# Patient Record
Sex: Male | Born: 1990 | Race: White | Hispanic: No | Marital: Married | Smoking: Never smoker
Health system: Southern US, Community
[De-identification: ages and names within clinical notes are randomized; demographics above are authoritative.]

## PROBLEM LIST (undated history)

## (undated) DIAGNOSIS — R7301 Impaired fasting glucose: Secondary | ICD-10-CM

## (undated) DIAGNOSIS — K219 Gastro-esophageal reflux disease without esophagitis: Secondary | ICD-10-CM

## (undated) DIAGNOSIS — E663 Overweight: Secondary | ICD-10-CM

## (undated) DIAGNOSIS — R7989 Other specified abnormal findings of blood chemistry: Secondary | ICD-10-CM

## (undated) HISTORY — DX: Other specified abnormal findings of blood chemistry: R79.89

## (undated) HISTORY — DX: Impaired fasting glucose: R73.01

## (undated) HISTORY — PX: APPENDECTOMY: SHX54

## (undated) HISTORY — DX: Overweight: E66.3

---
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
## 2012-11-12 DIAGNOSIS — Z87442 Personal history of urinary calculi: Secondary | ICD-10-CM | POA: Insufficient documentation

## 2015-09-04 ENCOUNTER — Other Ambulatory Visit
Admission: RE | Admit: 2015-09-04 | Discharge: 2015-09-04 | Disposition: A | Discharge status: Home / Self Care | Payer: Worker's Compensation | Attending: Family Medicine | Admitting: Family Medicine

## 2016-07-02 ENCOUNTER — Other Ambulatory Visit
Admission: EM | Admit: 2016-07-02 | Discharge: 2016-07-02 | Disposition: A | Discharge status: Home / Self Care | Payer: PRIVATE HEALTH INSURANCE | Attending: Family Medicine | Admitting: Family Medicine

## 2020-04-14 ENCOUNTER — Other Ambulatory Visit: Payer: Self-pay

## 2020-04-14 ENCOUNTER — Encounter: Payer: Self-pay | Admitting: *Deleted

## 2020-04-14 ENCOUNTER — Emergency Department: Payer: 59

## 2020-04-14 ENCOUNTER — Emergency Department
Admission: EM | Admit: 2020-04-14 | Discharge: 2020-04-15 | Disposition: A | Discharge status: Home / Self Care | Payer: 59 | Attending: Emergency Medicine | Admitting: Emergency Medicine

## 2020-04-14 DIAGNOSIS — N201 Calculus of ureter: Secondary | ICD-10-CM | POA: Diagnosis not present

## 2020-04-14 DIAGNOSIS — R1032 Left lower quadrant pain: Secondary | ICD-10-CM | POA: Diagnosis present

## 2020-04-14 DIAGNOSIS — R112 Nausea with vomiting, unspecified: Secondary | ICD-10-CM | POA: Diagnosis not present

## 2020-04-14 LAB — COMPREHENSIVE METABOLIC PANEL
ALT: 60 U/L — ABNORMAL HIGH (ref 0–44)
AST: 37 U/L (ref 15–41)
Albumin: 5.2 g/dL — ABNORMAL HIGH (ref 3.5–5.0)
Alkaline Phosphatase: 48 U/L (ref 38–126)
Anion gap: 17 — ABNORMAL HIGH (ref 5–15)
BUN: 17 mg/dL (ref 6–20)
CO2: 18 mmol/L — ABNORMAL LOW (ref 22–32)
Calcium: 10.2 mg/dL (ref 8.9–10.3)
Chloride: 103 mmol/L (ref 98–111)
Creatinine, Ser: 1.44 mg/dL — ABNORMAL HIGH (ref 0.61–1.24)
GFR calc Af Amer: >60 mL/min (ref >60)
GFR calc non Af Amer: >60 mL/min (ref >60)
Glucose, Bld: 149 mg/dL — ABNORMAL HIGH (ref 70–99)
Potassium: 3.9 mmol/L (ref 3.5–5.1)
Sodium: 138 mmol/L (ref 135–145)
Total Bilirubin: 0.9 mg/dL (ref 0.3–1.2)
Total Protein: 8.4 g/dL — ABNORMAL HIGH (ref 6.5–8.1)

## 2020-04-14 LAB — CBC
HCT: 48.1 % (ref 39.0–52.0)
Hemoglobin: 17.1 g/dL — ABNORMAL HIGH (ref 13.0–17.0)
MCH: 30.2 pg (ref 26.0–34.0)
MCHC: 35.6 g/dL (ref 30.0–36.0)
MCV: 84.8 fL (ref 80.0–100.0)
Platelets: 247 10*3/uL (ref 150–400)
RBC: 5.67 MIL/uL (ref 4.22–5.81)
RDW: 11.9 % (ref 11.5–15.5)
WBC: 13.1 10*3/uL — ABNORMAL HIGH (ref 4.0–10.5)
nRBC: 0 % (ref 0.0–0.2)

## 2020-04-14 MED ORDER — KETOROLAC TROMETHAMINE 10 MG PO TABS: 10.0000 mg | ORAL_TABLET | Freq: Four times a day (QID) | ORAL | 0 refills | Status: DC | PRN

## 2020-04-14 MED ORDER — SODIUM CHLORIDE 0.9 % IV BOLUS
1000.0000 mL | Freq: Once | INTRAVENOUS | Status: AC
  Administered 2020-04-14: 22:00:00 1000 mL via INTRAVENOUS

## 2020-04-14 MED ORDER — MORPHINE SULFATE (PF) 4 MG/ML IV SOLN: 4.0000 mg | Freq: Once | INTRAVENOUS | Status: AC

## 2020-04-14 MED ORDER — ONDANSETRON 4 MG PO TBDP
4.0000 mg | ORAL_TABLET | Freq: Three times a day (TID) | ORAL | 0 refills | Status: DC | PRN
Start: 2020-04-14 — End: 2020-04-30

## 2020-04-14 MED ORDER — ONDANSETRON HCL 4 MG/2ML IJ SOLN
INTRAMUSCULAR | Status: AC
  Administered 2020-04-14: 22:00:00 4 mg via INTRAVENOUS
  Filled 2020-04-14: qty 2

## 2020-04-14 MED ORDER — ONDANSETRON 4 MG PO TBDP
4.0000 mg | ORAL_TABLET | Freq: Three times a day (TID) | ORAL | 0 refills | Status: DC | PRN
Start: 2020-04-14 — End: 2020-04-14

## 2020-04-14 MED ORDER — KETOROLAC TROMETHAMINE 30 MG/ML IJ SOLN
INTRAMUSCULAR | Status: AC
  Administered 2020-04-14: 22:00:00 15 mg via INTRAVENOUS
  Filled 2020-04-14: qty 1

## 2020-04-14 MED ORDER — MORPHINE SULFATE (PF) 4 MG/ML IV SOLN
INTRAVENOUS | Status: AC
  Administered 2020-04-14: 22:00:00 4 mg via INTRAVENOUS
  Filled 2020-04-14: qty 1

## 2020-04-14 MED ORDER — ONDANSETRON HCL 4 MG/2ML IJ SOLN: 4.0000 mg | Freq: Once | INTRAMUSCULAR | Status: AC

## 2020-04-14 MED ORDER — KETOROLAC TROMETHAMINE 30 MG/ML IJ SOLN: 15.0000 mg | INTRAMUSCULAR | Status: AC

## 2020-04-14 MED ORDER — SODIUM CHLORIDE 0.9 % IV SOLN
1.5000 mg/kg | Freq: Once | INTRAVENOUS | Status: AC
  Administered 2020-04-14: 23:00:00 150 mg via INTRAVENOUS
  Filled 2020-04-14: qty 7.5

## 2020-04-14 NOTE — ED Provider Notes (Signed)
[facility] Emergency Department Provider Note  ____________________________________________  Time seen: Approximately 10:25 PM  I have reviewed the triage vital signs and the nursing notes.   HISTORY  Chief Complaint Flank Pain    HPI Jacob Payne is a 29 y.o. male   with no significant past medical history who comes the ED complaining of left flank pain radiating around to left lower quadrant and left groin that started earlier today.  Constant, waxing and waning, increasingly severe as the day is gone on.  No aggravating or alleviating factors.  Denies dysuria frequency urgency or hematuria.  No fever chills or diarrhea.  Does have some nausea and vomiting this afternoon.    Past medical history noncontributory   There are no problems to display for this patient.   Past surgical history appendectomy    Prior to Admission medications   Medication Sig Start Date End Date Taking? Authorizing Provider  ketorolac (TORADOL) 10 MG tablet Take 1 tablet (10 mg total) by mouth every 6 (six) hours as needed for moderate pain. 04/14/20   Sharman Cheek, MD  ondansetron (ZOFRAN ODT) 4 MG disintegrating tablet Take 1 tablet (4 mg total) by mouth every 8 (eight) hours as needed for nausea or vomiting. 04/14/20   Sharman Cheek, MD   none  Allergies Patient has no known allergies.   No family history on file.  Social History Social History   Tobacco Use  . Smoking status: Never Smoker  . Smokeless tobacco: Never Used  Substance Use Topics  . Alcohol use: Not on file  . Drug use: Not on file    Review of Systems  Constitutional:   No fever or chills.  ENT:   No sore throat. No rhinorrhea. Cardiovascular:   No chest pain or syncope. Respiratory:   No dyspnea or cough. Gastrointestinal:   Positive as above for left flank pain and vomiting  Musculoskeletal:   Negative for focal pain or swelling All other systems reviewed and are  negative except as documented above in ROS and HPI.  ____________________________________________   PHYSICAL EXAM:  VITAL SIGNS: ED Triage Vitals  Enc Vitals Group     BP 04/14/20 2159 (!) 138/92     Pulse Rate 04/14/20 2159 (!) 108     Resp 04/14/20 2159 (!) 22     Temp 04/14/20 2159 97.6 F (36.4 C)     Temp Source 04/14/20 2159 Oral     SpO2 04/14/20 2159 99 %     Weight 04/14/20 2201 220 lb (99.8 kg)     Height 04/14/20 2201 5\' 9"  (1.753 m)     Head Circumference --      Peak Flow --      Pain Score 04/14/20 2200 10     Pain Loc --      Pain Edu? --      Excl. in GC? --     Vital signs reviewed, nursing assessments reviewed.   Constitutional:   Alert and oriented. Non-toxic appearance. Eyes:   Conjunctivae are normal. EOMI. PERRL. ENT      Head:   Normocephalic and atraumatic.      Nose:   Wearing a mask.      Mouth/Throat:   Wearing a mask.      Neck:   No meningismus. Full ROM. Hematological/Lymphatic/Immunilogical:   No cervical or inguinal lymphadenopathy. Cardiovascular:   RRR. Symmetric bilateral radial and DP pulses.  No murmurs. Cap refill less than 2 seconds. Respiratory:  Normal respiratory effort without tachypnea/retractions. Breath sounds are clear and equal bilaterally. No wheezes/rales/rhonchi. Gastrointestinal:   Soft with left sided tenderness. Non distended. There is no CVA tenderness.  No rebound, rigidity, or guarding. No ventral or inguinal hernia Musculoskeletal:   Normal range of motion in all extremities. No joint effusions.  No lower extremity tenderness.  No edema. Neurologic:   Normal speech and language.  Motor grossly intact. No acute focal neurologic deficits are appreciated.  Skin:    Skin is warm, dry and intact. No rash noted.  No petechiae, purpura, or bullae.  ____________________________________________    LABS (pertinent positives/negatives) (all labs ordered are listed, but only abnormal results are displayed) Labs  Reviewed  CBC - Abnormal; Notable for the following components:      Result Value   WBC 13.1 (*)    Hemoglobin 17.1 (*)    All other components within normal limits  COMPREHENSIVE METABOLIC PANEL - Abnormal; Notable for the following components:   CO2 18 (*)    Glucose, Bld 149 (*)    Creatinine, Ser 1.44 (*)    Total Protein 8.4 (*)    Albumin 5.2 (*)    ALT 60 (*)    Anion gap 17 (*)    All other components within normal limits  URINALYSIS, COMPLETE (UACMP) WITH MICROSCOPIC   ____________________________________________   EKG    ____________________________________________    RADIOLOGY  DG Abdomen 1 View  Result Date: 04/14/2020 CLINICAL DATA:  Left-sided flank pain EXAM: ABDOMEN - 1 VIEW COMPARISON:  CT 08/27/2010 FINDINGS: Nonobstructed gas pattern. No calculi over the kidneys. Punctate calcifications in the pelvis probably phleboliths. Regional bones are within normal limits IMPRESSION: 1. Nonobstructed gas pattern 2. Punctate calcifications in the pelvis bilaterally some of which are consistent with phleboliths. If urinary tract calculi are suspected, correlation with CT KUB could be obtained. Electronically Signed   By: Jasmine Pang M.D.   On: 04/14/2020 22:39   CT Renal Stone Study  Result Date: 04/14/2020 CLINICAL DATA:  Left flank pain EXAM: CT ABDOMEN AND PELVIS WITHOUT CONTRAST TECHNIQUE: Multidetector CT imaging of the abdomen and pelvis was performed following the standard protocol without IV contrast. COMPARISON:  None. FINDINGS: Lower chest: The visualized heart size within normal limits. No pericardial fluid/thickening. No hiatal hernia. The visualized portions of the lungs are clear. Hepatobiliary: Although limited due to the lack of intravenous contrast, normal in appearance without gross focal abnormality. No evidence of calcified gallstones or biliary ductal dilatation. Pancreas:  Unremarkable.  No surrounding inflammatory changes. Spleen: Normal in size.  Although limited due to the lack of intravenous contrast, normal in appearance. Adrenals/Urinary Tract: Both adrenal glands appear normal. There is mild left-sided pelvicaliectasis and ureterectasis down to the level of the distal left ureter where there is a 3 mm calculus, series 2, image 83. Mild left proximal periureteral stranding is seen. The bladder is unremarkable. No right-sided renal or collecting system calculi are noted. Stomach/Bowel: The stomach, small bowel, and colon are normal in appearance. No inflammatory changes or obstructive findings. The patient is status post appendectomy. Vascular/Lymphatic: There are no enlarged abdominal or pelvic lymph nodes. No significant gross vascular findings are present. Reproductive: The prostate is unremarkable. Other: No evidence of abdominal wall mass or hernia. Musculoskeletal: No acute or significant osseous findings. IMPRESSION: 3 mm left distal ureteral calculus causing mild left hydronephrosis. Electronically Signed   By: Jonna Clark M.D.   On: 04/14/2020 23:11    ____________________________________________   PROCEDURES  Procedures  ____________________________________________  DIFFERENTIAL DIAGNOSIS   Ureterolithiasis, cystitis, diverticulitlis, AGE.   CLINICAL IMPRESSION / ASSESSMENT AND PLAN / ED COURSE  Medications ordered in the ED: Medications  lidocaine (XYLOCAINE) 150 mg in sodium chloride 0.9 % 100 mL IVPB (has no administration in time range)  ketorolac (TORADOL) 30 MG/ML injection 15 mg (15 mg Intravenous Given 04/14/20 2217)  morphine 4 MG/ML injection 4 mg (4 mg Intravenous Given 04/14/20 2217)  ondansetron (ZOFRAN) injection 4 mg (4 mg Intravenous Given 04/14/20 2217)  sodium chloride 0.9 % bolus 1,000 mL (1,000 mLs Intravenous New Bag/Given 04/14/20 2218)    Pertinent labs & imaging results that were available during Jacob care of the patient were reviewed by me and considered in Jacob medical decision making (see chart for  details).  Jacob Payne was evaluated in Emergency Department on 04/14/2020 for the symptoms described in the history of present illness. He was evaluated in the context of the global COVID-19 pandemic, which necessitated consideration that the patient might be at risk for infection with the SARS-CoV-2 virus that causes COVID-19. Institutional protocols and algorithms that pertain to the evaluation of patients at risk for COVID-19 are in a state of rapid change based on information released by regulatory bodies including the CDC and federal and state organizations. These policies and algorithms were followed during the patient's care in the ED.   Pt p/w l flank pain and exam highly suggestive of renal colic. Doubt sepsis, torsion, incarcerated hernia. No acute abdomen on exam. Will give iv toradol 15mg , iv morphine 4mg  for pain relief, obtain labs and KUB.   Clinical Course as of Apr 14 2322  Tue Apr 14, 2020  2249 Still severe pain despite morphine and Toradol.  Will give IV lidocaine, obtain CT renal stone study.   [PS]    Clinical Course User Index [PS] Carrie Mew, MD    ----------------------------------------- 11:22 PM on 04/14/2020 -----------------------------------------  CT scan shows 43mm stone in distal left ureter with mild hydronephrosis. Will f/u UA, plan for outpatient follow up.    ____________________________________________   FINAL CLINICAL IMPRESSION(S) / ED DIAGNOSES    Final diagnoses:  Ureterolithiasis     ED Discharge Orders         Ordered    ketorolac (TORADOL) 10 MG tablet  Every 6 hours PRN     04/14/20 2320    ondansetron (ZOFRAN ODT) 4 MG disintegrating tablet  Every 8 hours PRN     04/14/20 2320          Portions of this note were generated with dragon dictation software. Dictation errors may occur despite best attempts at proofreading.   Carrie Mew, MD 04/14/20 [phone]

## 2020-04-14 NOTE — Discharge Instructions (Signed)
Your CT scan shows a small stone in the left ureter causing the pain. It has almost reached the bladder, at which point your symptoms will dramatically improve. Drink lots of fluids for hydration to help pass the stone.

## 2020-04-14 NOTE — ED Notes (Signed)
Pt taken to CT.

## 2020-04-14 NOTE — ED Notes (Signed)
Pt unable to void at this time. 

## 2020-04-14 NOTE — ED Triage Notes (Signed)
Pt reports left flank pain for 1 days.  Pt has n/v.  Diaphoretic in triage. Pt pale.

## 2020-04-15 ENCOUNTER — Other Ambulatory Visit: Payer: Self-pay

## 2020-04-15 ENCOUNTER — Other Ambulatory Visit: Payer: Self-pay | Admitting: Urology

## 2020-04-15 ENCOUNTER — Ambulatory Visit (INDEPENDENT_AMBULATORY_CARE_PROVIDER_SITE_OTHER): Payer: 59 | Admitting: Urology

## 2020-04-15 ENCOUNTER — Encounter: Payer: Self-pay | Admitting: Urology

## 2020-04-15 ENCOUNTER — Ambulatory Visit
Admission: RE | Admit: 2020-04-15 | Discharge: 2020-04-15 | Disposition: A | Discharge status: Home / Self Care | Payer: 59 | Source: Ambulatory Visit | Attending: Urology | Admitting: Urology

## 2020-04-15 VITALS — BP 131/90 | HR 93 | Ht 69.0 in | Wt 220.0 lb

## 2020-04-15 DIAGNOSIS — N23 Unspecified renal colic: Secondary | ICD-10-CM | POA: Diagnosis not present

## 2020-04-15 DIAGNOSIS — N201 Calculus of ureter: Secondary | ICD-10-CM

## 2020-04-15 DIAGNOSIS — N132 Hydronephrosis with renal and ureteral calculous obstruction: Secondary | ICD-10-CM | POA: Diagnosis not present

## 2020-04-15 DIAGNOSIS — N200 Calculus of kidney: Secondary | ICD-10-CM

## 2020-04-15 LAB — URINALYSIS, COMPLETE (UACMP) WITH MICROSCOPIC
Bacteria, UA: NONE SEEN
Bilirubin Urine: NEGATIVE
Glucose, UA: NEGATIVE mg/dL
Ketones, ur: 20 mg/dL — AB
Leukocytes,Ua: NEGATIVE
Nitrite: NEGATIVE
Protein, ur: 30 mg/dL — AB
RBC / HPF: >50 RBC/hpf — ABNORMAL HIGH (ref 0–5)
Specific Gravity, Urine: 1.031 — ABNORMAL HIGH (ref 1.005–1.030)
Squamous Epithelial / HPF: NONE SEEN (ref 0–5)
pH: 5 (ref 5.0–8.0)

## 2020-04-15 MED ORDER — OXYCODONE-ACETAMINOPHEN 5-325 MG PO TABS: 1.0000 | ORAL_TABLET | ORAL | 0 refills | Status: DC | PRN

## 2020-04-15 MED ORDER — SODIUM CHLORIDE 0.9 % IV BOLUS
1000.0000 mL | Freq: Once | INTRAVENOUS | Status: AC
  Administered 2020-04-15: 01:00:00 1000 mL via INTRAVENOUS

## 2020-04-15 MED ORDER — TAMSULOSIN HCL 0.4 MG PO CAPS: 0.4000 mg | ORAL_CAPSULE | Freq: Every day | ORAL | 0 refills | Status: DC

## 2020-04-15 MED ORDER — ONDANSETRON HCL 4 MG/2ML IJ SOLN
4.0000 mg | Freq: Once | INTRAMUSCULAR | Status: AC
  Administered 2020-04-15: 02:00:00 4 mg via INTRAVENOUS

## 2020-04-15 MED ORDER — OXYCODONE-ACETAMINOPHEN 5-325 MG PO TABS
2.0000 | ORAL_TABLET | Freq: Once | ORAL | Status: AC
  Administered 2020-04-15: 02:00:00 2 via ORAL
  Filled 2020-04-15: qty 2

## 2020-04-15 MED ORDER — ONDANSETRON HCL 4 MG/2ML IJ SOLN
4.0000 mg | Freq: Once | INTRAMUSCULAR | Status: AC
  Administered 2020-04-15: 4 mg via INTRAVENOUS
  Filled 2020-04-15: qty 2

## 2020-04-15 MED ORDER — MORPHINE SULFATE (PF) 4 MG/ML IV SOLN
4.0000 mg | Freq: Once | INTRAVENOUS | Status: AC
  Administered 2020-04-15: 02:00:00 4 mg via INTRAVENOUS
  Filled 2020-04-15: qty 1

## 2020-04-15 MED ORDER — ONDANSETRON HCL 4 MG/2ML IJ SOLN
INTRAMUSCULAR | Status: AC
  Filled 2020-04-15: qty 2

## 2020-04-16 ENCOUNTER — Ambulatory Visit
Admission: RE | Admit: 2020-04-16 | Discharge: 2020-04-16 | Disposition: A | Discharge status: Home / Self Care | Payer: 59 | Source: Ambulatory Visit | Attending: Urology | Admitting: Urology

## 2020-04-16 ENCOUNTER — Encounter: Payer: Self-pay | Admitting: Urology

## 2020-04-16 ENCOUNTER — Other Ambulatory Visit: Payer: Self-pay | Admitting: Radiology

## 2020-04-16 ENCOUNTER — Encounter
Admission: RE | Disposition: A | Discharge status: Home / Self Care | Payer: Self-pay | Source: Ambulatory Visit | Attending: Urology

## 2020-04-16 DIAGNOSIS — N201 Calculus of ureter: Secondary | ICD-10-CM

## 2020-04-16 DIAGNOSIS — N132 Hydronephrosis with renal and ureteral calculous obstruction: Secondary | ICD-10-CM | POA: Insufficient documentation

## 2020-04-16 HISTORY — PX: EXTRACORPOREAL SHOCK WAVE LITHOTRIPSY: SHX1557

## 2020-04-16 SURGERY — LITHOTRIPSY, ESWL
Anesthesia: Moderate Sedation | Laterality: Left

## 2020-04-16 MED ORDER — KETOROLAC TROMETHAMINE 30 MG/ML IJ SOLN
INTRAMUSCULAR | Status: AC
  Filled 2020-04-16: qty 1

## 2020-04-16 MED ORDER — DIPHENHYDRAMINE HCL 25 MG PO CAPS
ORAL_CAPSULE | ORAL | Status: AC
  Administered 2020-04-16: 25 mg via ORAL
  Filled 2020-04-16: qty 1

## 2020-04-16 MED ORDER — KETOROLAC TROMETHAMINE 30 MG/ML IJ SOLN
30.0000 mg | Freq: Once | INTRAMUSCULAR | Status: AC
  Administered 2020-04-16: 30 mg via INTRAVENOUS

## 2020-04-16 MED ORDER — ONDANSETRON HCL 4 MG/2ML IJ SOLN
INTRAMUSCULAR | Status: AC
  Administered 2020-04-16: 4 mg via INTRAVENOUS
  Filled 2020-04-16: qty 2

## 2020-04-16 MED ORDER — DIAZEPAM 5 MG PO TABS
ORAL_TABLET | ORAL | Status: AC
  Administered 2020-04-16: 10:00:00 10 mg via ORAL
  Filled 2020-04-16: qty 2

## 2020-04-16 MED ORDER — SODIUM CHLORIDE 0.9 % IV SOLN: INTRAVENOUS | Status: DC

## 2020-04-16 MED ORDER — DIPHENHYDRAMINE HCL 25 MG PO CAPS: 25.0000 mg | ORAL_CAPSULE | ORAL | Status: AC

## 2020-04-16 MED ORDER — SODIUM CHLORIDE FLUSH 0.9 % IV SOLN
INTRAVENOUS | Status: AC
  Filled 2020-04-16: qty 10

## 2020-04-16 MED ORDER — CIPROFLOXACIN HCL 500 MG PO TABS
ORAL_TABLET | ORAL | Status: AC
  Administered 2020-04-16: 500 mg via ORAL
  Filled 2020-04-16: qty 1

## 2020-04-16 MED ORDER — ONDANSETRON HCL 4 MG/2ML IJ SOLN: 4.0000 mg | Freq: Once | INTRAMUSCULAR | Status: AC | PRN

## 2020-04-16 MED ORDER — DIAZEPAM 5 MG PO TABS: 10.0000 mg | ORAL_TABLET | ORAL | Status: AC

## 2020-04-16 MED ORDER — CIPROFLOXACIN HCL 500 MG PO TABS: 500.0000 mg | ORAL_TABLET | ORAL | Status: AC

## 2020-04-16 NOTE — Discharge Instructions (Signed)
As per the [facility] stone [facility] discharge instructions.  Continue tamsulosin to help pass stone fragments.  Continue pain medication as needed.  Call [phone] for pain not controlled with oral medications, fever greater than 101 degrees or any questions or concerns

## 2020-04-16 NOTE — Progress Notes (Signed)
 04/15/2020 11:37 AM   Chisum A Sacra 01/12/1991 [id number]  Referring provider: No referring provider defined for this encounter.  Chief Complaint  Patient presents with  . Nephrolithiasis    HPI: Jacob Payne is a 29 y.o. male who presents for ED follow-up of renal colic.  He presented to the ED on 04/14/2020 with a several day history of left lower quadrant abdominal pain which radiated to the left groin region.  No identifiable precipitating, aggravating or alleviating factors.  Pain was described as severe.  Denied fever, chills.  He has had intermittent nausea and vomiting.  No bothersome lower urinary tract symptoms.  No prior history of stone disease. Stone protocol CT of the abdomen pelvis showed a 3 mm left distal ureteral calculus with mild left hydronephrosis/hydroureter.  He received parenteral analgesics with good pain control and was discharged on oxycodone, ketorolac, Zofran and tamsulosin.  For the past 12 hours he has been minimally symptomatic.  PMH: No past medical history on file.  Surgical History: No past surgical history on file.  Home Medications:  Allergies as of 04/15/2020   No Known Allergies     Medication List       Accurate as of April 15, 2020 11:59 PM. If you have any questions, ask your nurse or doctor.        ketorolac 10 MG tablet Commonly known as: TORADOL Take 1 tablet (10 mg total) by mouth every 6 (six) hours as needed for moderate pain.   ondansetron 4 MG disintegrating tablet Commonly known as: Zofran ODT Take 1 tablet (4 mg total) by mouth every 8 (eight) hours as needed for nausea or vomiting.   oxyCODONE-acetaminophen 5-325 MG tablet Commonly known as: Percocet Take 1 tablet by mouth every 4 (four) hours as needed.   tamsulosin 0.4 MG Caps capsule Commonly known as: Flomax Take 1 capsule (0.4 mg total) by mouth daily after breakfast.       Allergies: No Known Allergies  Family History: No family history  on file.  Social History:  reports that he has never smoked. He has never used smokeless tobacco. He reports that he does not drink alcohol or use drugs.   Physical Exam: BP 131/90   Pulse 93   Ht 5' 9" (1.753 m)   Wt 220 lb (99.8 kg)   BMI 32.49 kg/m   Constitutional:  Alert and oriented, No acute distress. HEENT: [place] AT, moist mucus membranes.  Trachea midline, no masses. Cardiovascular: No clubbing, cyanosis, or edema. RRR Respiratory: Normal respiratory effort, no increased work of breathing. Clear GI: Abdomen is soft, nontender, nondistended, no abdominal masses GU: No CVA tenderness Skin: No rashes, bruises or suspicious lesions. Neurologic: Grossly intact, no focal deficits, moving all 4 extremities. Psychiatric: Normal mood and affect.    Pertinent Imaging: KUB and CT images were personally reviewed Results for orders placed during the hospital encounter of 04/15/20  Abdomen 1 view (KUB)   Narrative CLINICAL DATA:  Ureteral calculus.  Left-sided pain.  EXAM: ABDOMEN - 1 VIEW  COMPARISON:  04/14/2020 CT renal stone.  FINDINGS: No calcific densities overlie the bilateral renal shadows. Triangular sub-5 mm calcific density within the left hemipelvis may reflect distal left ureteral stone visualized on recent CT. Adjacent rounded calcific densities within the pelvis reflect phleboliths.  Nonobstructive bowel gas pattern. No acute osseous abnormalities. Small left sacral ala bone island.  IMPRESSION: Re-demonstration of sub 5 mm distal left ureteral calculus, likely proximal to or at the UVJ.     Electronically Signed   By: Stana Bunting M.D.   On: 04/15/2020 21:46     Results for orders placed during the hospital encounter of 04/14/20  CT Renal Stone Study   Narrative CLINICAL DATA:  Left flank pain  EXAM: CT ABDOMEN AND PELVIS WITHOUT CONTRAST  TECHNIQUE: Multidetector CT imaging of the abdomen and pelvis was performed following the standard  protocol without IV contrast.  COMPARISON:  None.  FINDINGS: Lower chest: The visualized heart size within normal limits. No pericardial fluid/thickening.  No hiatal hernia.  The visualized portions of the lungs are clear.  Hepatobiliary: Although limited due to the lack of intravenous contrast, normal in appearance without gross focal abnormality. No evidence of calcified gallstones or biliary ductal dilatation.  Pancreas:  Unremarkable.  No surrounding inflammatory changes.  Spleen: Normal in size. Although limited due to the lack of intravenous contrast, normal in appearance.  Adrenals/Urinary Tract: Both adrenal glands appear normal. There is mild left-sided pelvicaliectasis and ureterectasis down to the level of the distal left ureter where there is a 3 mm calculus, series 2, image 83. Mild left proximal periureteral stranding is seen. The bladder is unremarkable. No right-sided renal or collecting system calculi are noted.  Stomach/Bowel: The stomach, small bowel, and colon are normal in appearance. No inflammatory changes or obstructive findings. The patient is status post appendectomy.  Vascular/Lymphatic: There are no enlarged abdominal or pelvic lymph nodes. No significant gross vascular findings are present.  Reproductive: The prostate is unremarkable.  Other: No evidence of abdominal wall mass or hernia.  Musculoskeletal: No acute or significant osseous findings.  IMPRESSION: 3 mm left distal ureteral calculus causing mild left hydronephrosis.   Electronically Signed   By: Jonna Clark M.D.   On: 04/14/2020 23:11     Assessment & Plan:    - Left distal ureteral calculus with history of renal colic We discussed various treatment options for urolithiasis including observation with or without medical expulsive therapy, shockwave lithotripsy (SWL), ureteroscopy and laser lithotripsy with stent placement.  We discussed that management is based on stone  size, location, density, patient co-morbidities, and patient preference.   Stones <58mm in size have a >80% spontaneous passage rate. Data surrounding the use of tamsulosin for medical expulsive therapy is controversial, but meta analyses suggests it is most efficacious for distal stones between 5-20mm in size. Possible side effects include dizziness/lightheadedness, and retrograde ejaculation.  SWL has a lower stone free rate in a single procedure, but also a lower complication rate compared to ureteroscopy and avoids a stent and associated stent related symptoms. Possible complications include renal hematoma, steinstrasse, and need for additional treatment.  Ureteroscopy with laser lithotripsy and stent placement has a higher stone free rate than SWL in a single procedure, however increased complication rate including possible infection, ureteral injury, bleeding, and stent related morbidity. Common stent related symptoms include dysuria, urgency/frequency, and flank pain.  After an extensive discussion of the risks and benefits of the above treatment options, the patient would like to proceed with trial of passage/medical expulsion therapy.  He was informed the mobile lithotripter is here tomorrow and I did recommend he be n.p.o. after midnight. If he experiences recurrent colic and elects shockwave lithotripsy he can call in the morning.   Riki Altes, MD  [facility] [address], [address] [place], [place] [address] [phone]

## 2020-04-16 NOTE — Interval H&P Note (Signed)
History and Physical Interval Note: Patient did experience recurrent renal colic described as severe last night and has elected to proceed with shockwave lithotripsy today.  The procedure was discussed in detail at yesterday's office visit.  04/16/2020 11:48 AM  Jacob Payne  has presented today for surgery, with the diagnosis of Kidney stone.  The various methods of treatment have been discussed with the patient and family. After consideration of risks, benefits and other options for treatment, the patient has consented to  Procedure(s): EXTRACORPOREAL SHOCK WAVE LITHOTRIPSY (ESWL) (Left) as a surgical intervention.  The patient's history has been reviewed, patient examined, no change in status, stable for surgery.  I have reviewed the patient's chart and labs.  Questions were answered to the patient's satisfaction.     Violette Morneault C Sharnetta Gielow

## 2020-04-16 NOTE — H&P (View-Only) (Signed)
04/15/2020 11:37 AM   Jacob Payne 02/17/1991 [id number]  Referring provider: No referring provider defined for this encounter.  Chief Complaint  Patient presents with  . Nephrolithiasis    HPI: Jacob Payne is a 29 y.o. male who presents for ED follow-up of renal colic.  He presented to the ED on 04/14/2020 with a several day history of left lower quadrant abdominal pain which radiated to the left groin region.  No identifiable precipitating, aggravating or alleviating factors.  Pain was described as severe.  Denied fever, chills.  He has had intermittent nausea and vomiting.  No bothersome lower urinary tract symptoms.  No prior history of stone disease. Stone protocol CT of the abdomen pelvis showed a 3 mm left distal ureteral calculus with mild left hydronephrosis/hydroureter.  He received parenteral analgesics with good pain control and was discharged on oxycodone, ketorolac, Zofran and tamsulosin.  For the past 12 hours he has been minimally symptomatic.  PMH: No past medical history on file.  Surgical History: No past surgical history on file.  Home Medications:  Allergies as of 04/15/2020   No Known Allergies     Medication List       Accurate as of April 15, 2020 11:59 PM. If you have any questions, ask your nurse or doctor.        ketorolac 10 MG tablet Commonly known as: TORADOL Take 1 tablet (10 mg total) by mouth every 6 (six) hours as needed for moderate pain.   ondansetron 4 MG disintegrating tablet Commonly known as: Zofran ODT Take 1 tablet (4 mg total) by mouth every 8 (eight) hours as needed for nausea or vomiting.   oxyCODONE-acetaminophen 5-325 MG tablet Commonly known as: Percocet Take 1 tablet by mouth every 4 (four) hours as needed.   tamsulosin 0.4 MG Caps capsule Commonly known as: Flomax Take 1 capsule (0.4 mg total) by mouth daily after breakfast.       Allergies: No Known Allergies  Family History: No family history  on file.  Social History:  reports that he has never smoked. He has never used smokeless tobacco. He reports that he does not drink alcohol or use drugs.   Physical Exam: BP 131/90   Pulse 93   Ht 5\' 9"  (1.753 m)   Wt 220 lb (99.8 kg)   BMI 32.49 kg/m   Constitutional:  Alert and oriented, No acute distress. HEENT: [place] AT, moist mucus membranes.  Trachea midline, no masses. Cardiovascular: No clubbing, cyanosis, or edema. RRR Respiratory: Normal respiratory effort, no increased work of breathing. Clear GI: Abdomen is soft, nontender, nondistended, no abdominal masses GU: No CVA tenderness Skin: No rashes, bruises or suspicious lesions. Neurologic: Grossly intact, no focal deficits, moving all 4 extremities. Psychiatric: Normal mood and affect.    Pertinent Imaging: KUB and CT images were personally reviewed Results for orders placed during the hospital encounter of 04/15/20  Abdomen 1 view (KUB)   Narrative CLINICAL DATA:  Ureteral calculus.  Left-sided pain.  EXAM: ABDOMEN - 1 VIEW  COMPARISON:  04/14/2020 CT renal stone.  FINDINGS: No calcific densities overlie the bilateral renal shadows. Triangular sub-5 mm calcific density within the left hemipelvis may reflect distal left ureteral stone visualized on recent CT. Adjacent rounded calcific densities within the pelvis reflect phleboliths.  Nonobstructive bowel gas pattern. No acute osseous abnormalities. Small left sacral ala bone island.  IMPRESSION: Re-demonstration of sub 5 mm distal left ureteral calculus, likely proximal to or at the UVJ.  Electronically Signed   By: Stana Bunting M.D.   On: 04/15/2020 21:46     Results for orders placed during the hospital encounter of 04/14/20  CT Renal Stone Study   Narrative CLINICAL DATA:  Left flank pain  EXAM: CT ABDOMEN AND PELVIS WITHOUT CONTRAST  TECHNIQUE: Multidetector CT imaging of the abdomen and pelvis was performed following the standard  protocol without IV contrast.  COMPARISON:  None.  FINDINGS: Lower chest: The visualized heart size within normal limits. No pericardial fluid/thickening.  No hiatal hernia.  The visualized portions of the lungs are clear.  Hepatobiliary: Although limited due to the lack of intravenous contrast, normal in appearance without gross focal abnormality. No evidence of calcified gallstones or biliary ductal dilatation.  Pancreas:  Unremarkable.  No surrounding inflammatory changes.  Spleen: Normal in size. Although limited due to the lack of intravenous contrast, normal in appearance.  Adrenals/Urinary Tract: Both adrenal glands appear normal. There is mild left-sided pelvicaliectasis and ureterectasis down to the level of the distal left ureter where there is a 3 mm calculus, series 2, image 83. Mild left proximal periureteral stranding is seen. The bladder is unremarkable. No right-sided renal or collecting system calculi are noted.  Stomach/Bowel: The stomach, small bowel, and colon are normal in appearance. No inflammatory changes or obstructive findings. The patient is status post appendectomy.  Vascular/Lymphatic: There are no enlarged abdominal or pelvic lymph nodes. No significant gross vascular findings are present.  Reproductive: The prostate is unremarkable.  Other: No evidence of abdominal wall mass or hernia.  Musculoskeletal: No acute or significant osseous findings.  IMPRESSION: 3 mm left distal ureteral calculus causing mild left hydronephrosis.   Electronically Signed   By: Jonna Clark M.D.   On: 04/14/2020 23:11     Assessment & Plan:    - Left distal ureteral calculus with history of renal colic We discussed various treatment options for urolithiasis including observation with or without medical expulsive therapy, shockwave lithotripsy (SWL), ureteroscopy and laser lithotripsy with stent placement.  We discussed that management is based on stone  size, location, density, patient co-morbidities, and patient preference.   Stones <58mm in size have a >80% spontaneous passage rate. Data surrounding the use of tamsulosin for medical expulsive therapy is controversial, but meta analyses suggests it is most efficacious for distal stones between 5-20mm in size. Possible side effects include dizziness/lightheadedness, and retrograde ejaculation.  SWL has a lower stone free rate in a single procedure, but also a lower complication rate compared to ureteroscopy and avoids a stent and associated stent related symptoms. Possible complications include renal hematoma, steinstrasse, and need for additional treatment.  Ureteroscopy with laser lithotripsy and stent placement has a higher stone free rate than SWL in a single procedure, however increased complication rate including possible infection, ureteral injury, bleeding, and stent related morbidity. Common stent related symptoms include dysuria, urgency/frequency, and flank pain.  After an extensive discussion of the risks and benefits of the above treatment options, the patient would like to proceed with trial of passage/medical expulsion therapy.  He was informed the mobile lithotripter is here tomorrow and I did recommend he be n.p.o. after midnight. If he experiences recurrent colic and elects shockwave lithotripsy he can call in the morning.   Riki Altes, MD  [facility] [address], [address] [place], [place] [address] [phone]

## 2020-04-16 NOTE — H&P (View-Only) (Signed)
 04/15/2020 11:37 AM   Jacob Payne 06/30/1991 [id number]  Referring provider: No referring provider defined for this encounter.  Chief Complaint  Patient presents with  . Nephrolithiasis    HPI: Jacob Payne is a 29 y.o. male who presents for ED follow-up of renal colic.  He presented to the ED on 04/14/2020 with a several day history of left lower quadrant abdominal pain which radiated to the left groin region.  No identifiable precipitating, aggravating or alleviating factors.  Pain was described as severe.  Denied fever, chills.  He has had intermittent nausea and vomiting.  No bothersome lower urinary tract symptoms.  No prior history of stone disease. Stone protocol CT of the abdomen pelvis showed a 3 mm left distal ureteral calculus with mild left hydronephrosis/hydroureter.  He received parenteral analgesics with good pain control and was discharged on oxycodone, ketorolac, Zofran and tamsulosin.  For the past 12 hours he has been minimally symptomatic.  PMH: No past medical history on file.  Surgical History: No past surgical history on file.  Home Medications:  Allergies as of 04/15/2020   No Known Allergies     Medication List       Accurate as of April 15, 2020 11:59 PM. If you have any questions, ask your nurse or doctor.        ketorolac 10 MG tablet Commonly known as: TORADOL Take 1 tablet (10 mg total) by mouth every 6 (six) hours as needed for moderate pain.   ondansetron 4 MG disintegrating tablet Commonly known as: Zofran ODT Take 1 tablet (4 mg total) by mouth every 8 (eight) hours as needed for nausea or vomiting.   oxyCODONE-acetaminophen 5-325 MG tablet Commonly known as: Percocet Take 1 tablet by mouth every 4 (four) hours as needed.   tamsulosin 0.4 MG Caps capsule Commonly known as: Flomax Take 1 capsule (0.4 mg total) by mouth daily after breakfast.       Allergies: No Known Allergies  Family History: No family history  on file.  Social History:  reports that he has never smoked. He has never used smokeless tobacco. He reports that he does not drink alcohol or use drugs.   Physical Exam: BP 131/90   Pulse 93   Ht 5' 9" (1.753 m)   Wt 220 lb (99.8 kg)   BMI 32.49 kg/m   Constitutional:  Alert and oriented, No acute distress. HEENT: [place] AT, moist mucus membranes.  Trachea midline, no masses. Cardiovascular: No clubbing, cyanosis, or edema. RRR Respiratory: Normal respiratory effort, no increased work of breathing. Clear GI: Abdomen is soft, nontender, nondistended, no abdominal masses GU: No CVA tenderness Skin: No rashes, bruises or suspicious lesions. Neurologic: Grossly intact, no focal deficits, moving all 4 extremities. Psychiatric: Normal mood and affect.    Pertinent Imaging: KUB and CT images were personally reviewed Results for orders placed during the hospital encounter of 04/15/20  Abdomen 1 view (KUB)   Narrative CLINICAL DATA:  Ureteral calculus.  Left-sided pain.  EXAM: ABDOMEN - 1 VIEW  COMPARISON:  04/14/2020 CT renal stone.  FINDINGS: No calcific densities overlie the bilateral renal shadows. Triangular sub-5 mm calcific density within the left hemipelvis may reflect distal left ureteral stone visualized on recent CT. Adjacent rounded calcific densities within the pelvis reflect phleboliths.  Nonobstructive bowel gas pattern. No acute osseous abnormalities. Small left sacral ala bone island.  IMPRESSION: Re-demonstration of sub 5 mm distal left ureteral calculus, likely proximal to or at the UVJ.     Electronically Signed   By: Stana Bunting M.D.   On: 04/15/2020 21:46     Results for orders placed during the hospital encounter of 04/14/20  CT Renal Stone Study   Narrative CLINICAL DATA:  Left flank pain  EXAM: CT ABDOMEN AND PELVIS WITHOUT CONTRAST  TECHNIQUE: Multidetector CT imaging of the abdomen and pelvis was performed following the standard  protocol without IV contrast.  COMPARISON:  None.  FINDINGS: Lower chest: The visualized heart size within normal limits. No pericardial fluid/thickening.  No hiatal hernia.  The visualized portions of the lungs are clear.  Hepatobiliary: Although limited due to the lack of intravenous contrast, normal in appearance without gross focal abnormality. No evidence of calcified gallstones or biliary ductal dilatation.  Pancreas:  Unremarkable.  No surrounding inflammatory changes.  Spleen: Normal in size. Although limited due to the lack of intravenous contrast, normal in appearance.  Adrenals/Urinary Tract: Both adrenal glands appear normal. There is mild left-sided pelvicaliectasis and ureterectasis down to the level of the distal left ureter where there is a 3 mm calculus, series 2, image 83. Mild left proximal periureteral stranding is seen. The bladder is unremarkable. No right-sided renal or collecting system calculi are noted.  Stomach/Bowel: The stomach, small bowel, and colon are normal in appearance. No inflammatory changes or obstructive findings. The patient is status post appendectomy.  Vascular/Lymphatic: There are no enlarged abdominal or pelvic lymph nodes. No significant gross vascular findings are present.  Reproductive: The prostate is unremarkable.  Other: No evidence of abdominal wall mass or hernia.  Musculoskeletal: No acute or significant osseous findings.  IMPRESSION: 3 mm left distal ureteral calculus causing mild left hydronephrosis.   Electronically Signed   By: Jonna Clark M.D.   On: 04/14/2020 23:11     Assessment & Plan:    - Left distal ureteral calculus with history of renal colic We discussed various treatment options for urolithiasis including observation with or without medical expulsive therapy, shockwave lithotripsy (SWL), ureteroscopy and laser lithotripsy with stent placement.  We discussed that management is based on stone  size, location, density, patient co-morbidities, and patient preference.   Stones <58mm in size have a >80% spontaneous passage rate. Data surrounding the use of tamsulosin for medical expulsive therapy is controversial, but meta analyses suggests it is most efficacious for distal stones between 5-20mm in size. Possible side effects include dizziness/lightheadedness, and retrograde ejaculation.  SWL has a lower stone free rate in a single procedure, but also a lower complication rate compared to ureteroscopy and avoids a stent and associated stent related symptoms. Possible complications include renal hematoma, steinstrasse, and need for additional treatment.  Ureteroscopy with laser lithotripsy and stent placement has a higher stone free rate than SWL in a single procedure, however increased complication rate including possible infection, ureteral injury, bleeding, and stent related morbidity. Common stent related symptoms include dysuria, urgency/frequency, and flank pain.  After an extensive discussion of the risks and benefits of the above treatment options, the patient would like to proceed with trial of passage/medical expulsion therapy.  He was informed the mobile lithotripter is here tomorrow and I did recommend he be n.p.o. after midnight. If he experiences recurrent colic and elects shockwave lithotripsy he can call in the morning.   Riki Altes, MD  [facility] [address], [address] [place], [place] [address] [phone]

## 2020-04-17 ENCOUNTER — Telehealth: Payer: Self-pay | Admitting: *Deleted

## 2020-04-17 MED ORDER — HYDROMORPHONE HCL 2 MG PO TABS: 2.0000 mg | ORAL_TABLET | ORAL | 0 refills | Status: DC | PRN

## 2020-04-17 NOTE — Telephone Encounter (Signed)
No pharmacy in the system.  Patient contacted and Rx hydromorphone sent to [facility]

## 2020-04-17 NOTE — Telephone Encounter (Signed)
Patient states he has been having lot of pain since last night at 12:00pm. He woke up this morning and feels better. He is worried that the pain will come back tonight and he thinks he will need some stronger pain medication .

## 2020-04-19 ENCOUNTER — Encounter: Payer: Self-pay | Admitting: Emergency Medicine

## 2020-04-19 ENCOUNTER — Other Ambulatory Visit: Payer: Self-pay

## 2020-04-19 DIAGNOSIS — R110 Nausea: Secondary | ICD-10-CM | POA: Diagnosis not present

## 2020-04-19 DIAGNOSIS — Z79899 Other long term (current) drug therapy: Secondary | ICD-10-CM | POA: Insufficient documentation

## 2020-04-19 DIAGNOSIS — N200 Calculus of kidney: Secondary | ICD-10-CM | POA: Insufficient documentation

## 2020-04-19 DIAGNOSIS — Z20822 Contact with and (suspected) exposure to covid-19: Secondary | ICD-10-CM | POA: Insufficient documentation

## 2020-04-19 DIAGNOSIS — R109 Unspecified abdominal pain: Secondary | ICD-10-CM | POA: Insufficient documentation

## 2020-04-19 LAB — CBC WITH DIFFERENTIAL/PLATELET
Abs Immature Granulocytes: 0.02 10*3/uL (ref 0.00–0.07)
Basophils Absolute: 0 10*3/uL (ref 0.0–0.1)
Basophils Relative: 1 %
Eosinophils Absolute: 0.2 10*3/uL (ref 0.0–0.5)
Eosinophils Relative: 2 %
HCT: 48 % (ref 39.0–52.0)
Hemoglobin: 16.2 g/dL (ref 13.0–17.0)
Immature Granulocytes: 0 %
Lymphocytes Relative: 22 %
Lymphs Abs: 1.8 10*3/uL (ref 0.7–4.0)
MCH: 29.9 pg (ref 26.0–34.0)
MCHC: 33.8 g/dL (ref 30.0–36.0)
MCV: 88.6 fL (ref 80.0–100.0)
Monocytes Absolute: 0.6 10*3/uL (ref 0.1–1.0)
Monocytes Relative: 7 %
Neutro Abs: 5.7 10*3/uL (ref 1.7–7.7)
Neutrophils Relative %: 68 %
Platelets: 223 10*3/uL (ref 150–400)
RBC: 5.42 MIL/uL (ref 4.22–5.81)
RDW: 11.8 % (ref 11.5–15.5)
WBC: 8.3 10*3/uL (ref 4.0–10.5)
nRBC: 0 % (ref 0.0–0.2)

## 2020-04-19 MED ORDER — MORPHINE SULFATE (PF) 4 MG/ML IV SOLN
4.0000 mg | Freq: Once | INTRAVENOUS | Status: AC
  Administered 2020-04-19: 4 mg via INTRAVENOUS

## 2020-04-19 MED ORDER — SODIUM CHLORIDE 0.9 % IV BOLUS
1000.0000 mL | Freq: Once | INTRAVENOUS | Status: AC
  Administered 2020-04-19: 1000 mL via INTRAVENOUS

## 2020-04-19 MED ORDER — MORPHINE SULFATE (PF) 4 MG/ML IV SOLN
INTRAVENOUS | Status: AC
  Filled 2020-04-19: qty 1

## 2020-04-19 MED ORDER — ONDANSETRON HCL 4 MG/2ML IJ SOLN
4.0000 mg | Freq: Once | INTRAMUSCULAR | Status: AC
  Administered 2020-04-19: 4 mg via INTRAVENOUS

## 2020-04-19 MED ORDER — ONDANSETRON HCL 4 MG/2ML IJ SOLN
INTRAMUSCULAR | Status: AC
  Filled 2020-04-19: qty 2

## 2020-04-19 NOTE — ED Triage Notes (Signed)
Pt arrives POV with c/o of known kidney stone which is creating left flank pain at this time and has not passed. Pt is stating that his at home pain medication is not working.

## 2020-04-20 ENCOUNTER — Telehealth: Payer: Self-pay | Admitting: Radiology

## 2020-04-20 ENCOUNTER — Emergency Department
Admission: EM | Admit: 2020-04-20 | Discharge: 2020-04-20 | Disposition: A | Discharge status: Home / Self Care | Payer: 59 | Attending: Emergency Medicine | Admitting: Emergency Medicine

## 2020-04-20 ENCOUNTER — Other Ambulatory Visit: Payer: Self-pay | Admitting: Radiology

## 2020-04-20 ENCOUNTER — Other Ambulatory Visit
Admission: RE | Admit: 2020-04-20 | Discharge: 2020-04-20 | Disposition: A | Discharge status: Home / Self Care | Payer: 59 | Source: Ambulatory Visit | Attending: Urology | Admitting: Urology

## 2020-04-20 ENCOUNTER — Emergency Department: Payer: 59

## 2020-04-20 DIAGNOSIS — N200 Calculus of kidney: Secondary | ICD-10-CM

## 2020-04-20 DIAGNOSIS — N201 Calculus of ureter: Secondary | ICD-10-CM

## 2020-04-20 DIAGNOSIS — R109 Unspecified abdominal pain: Secondary | ICD-10-CM

## 2020-04-20 LAB — URINALYSIS, COMPLETE (UACMP) WITH MICROSCOPIC
Bilirubin Urine: NEGATIVE
Glucose, UA: NEGATIVE mg/dL
Ketones, ur: 15 mg/dL — AB
Leukocytes,Ua: NEGATIVE
Nitrite: NEGATIVE
Specific Gravity, Urine: 1.025 (ref 1.005–1.030)
pH: 5.5 (ref 5.0–8.0)

## 2020-04-20 LAB — BASIC METABOLIC PANEL
Anion gap: 13 (ref 5–15)
BUN: 18 mg/dL (ref 6–20)
CO2: 25 mmol/L (ref 22–32)
Calcium: 9.4 mg/dL (ref 8.9–10.3)
Chloride: 99 mmol/L (ref 98–111)
Creatinine, Ser: 1.85 mg/dL — ABNORMAL HIGH (ref 0.61–1.24)
GFR calc Af Amer: 56 mL/min — ABNORMAL LOW (ref >60)
GFR calc non Af Amer: 48 mL/min — ABNORMAL LOW (ref >60)
Glucose, Bld: 102 mg/dL — ABNORMAL HIGH (ref 70–99)
Potassium: 3.8 mmol/L (ref 3.5–5.1)
Sodium: 137 mmol/L (ref 135–145)

## 2020-04-20 LAB — SARS CORONAVIRUS 2 (TAT 6-24 HRS): SARS Coronavirus 2: NEGATIVE

## 2020-04-20 MED ORDER — LACTATED RINGERS IV BOLUS
1000.0000 mL | Freq: Once | INTRAVENOUS | Status: AC
  Administered 2020-04-20: 1000 mL via INTRAVENOUS

## 2020-04-20 MED ORDER — KETOROLAC TROMETHAMINE 30 MG/ML IJ SOLN
15.0000 mg | Freq: Once | INTRAMUSCULAR | Status: AC
  Administered 2020-04-20: 15 mg via INTRAVENOUS
  Filled 2020-04-20: qty 1

## 2020-04-20 NOTE — ED Notes (Signed)
Patient states pain is decreased and he is doing well.

## 2020-04-20 NOTE — Telephone Encounter (Signed)
The safest thing to do would be to go ahead and add him on for tomorrow and if he passes the stone or has minimal pain overnight in the morning we can cancel.  His stone is very close to passing and he may have a lot of ureteral edema prohibiting passage.

## 2020-04-20 NOTE — Telephone Encounter (Signed)
Patient's fiance LMOM stating patient was seen in the ER today and told to call the office for possible procedure tomorrow. Please advise.

## 2020-04-20 NOTE — Telephone Encounter (Signed)
ED called me earlier this morning and I informed the ED physician I would add on ureteroscopy tomorrow if he was still having pain today

## 2020-04-20 NOTE — ED Provider Notes (Signed)
[facility] Emergency Department Provider Note   ____________________________________________   First MD Initiated Contact with Patient 04/20/20 [phone]     (approximate)  I have reviewed the triage vital signs and the nursing notes.   HISTORY  Chief Complaint Flank Pain    HPI Jacob Payne is a 29 y.o. male with past medical history of nephrolithiasis who presents to the ED complaining of flank pain.  Patient reports that he initially developed pain in his left flank about 1 week ago, was subsequently diagnosed with 3 mm kidney stone by CT scan.  He had lithotripsy performed with Dr. Bernardo Heater 4 days ago and initially states he was feeling better.  He then had worsening pain that was not controlled by his oxycodone or Toradol.  Urology prescribed 2 mg of Dilaudid p.o. which she states better controlled his pain up until 9 PM this evening.  At that time, he had onset of severe pain in his left flank that did not abate over multiple hours.  It was associated with nausea but no vomiting, he also denies any fevers or dysuria.  He received morphine and Zofran in triage which has significantly improved his pain.        History reviewed. No pertinent past medical history.  There are no problems to display for this patient.   Past Surgical History:  Procedure Laterality Date  . EXTRACORPOREAL SHOCK WAVE LITHOTRIPSY Left 04/16/2020   Procedure: EXTRACORPOREAL SHOCK WAVE LITHOTRIPSY (ESWL);  Surgeon: Abbie Sons, MD;  Location: [facility] ORS;  Service: Urology;  Laterality: Left;    Prior to Admission medications   Medication Sig Start Date End Date Taking? Authorizing Provider  HYDROmorphone (DILAUDID) 2 MG tablet Take 1 tablet (2 mg total) by mouth every 4 (four) hours as needed for severe pain. 04/17/20   Stoioff, Ronda Fairly, MD  ketorolac (TORADOL) 10 MG tablet Take 1 tablet (10 mg total) by mouth every 6 (six) hours as needed for moderate pain. 04/14/20    Carrie Mew, MD  ondansetron (ZOFRAN ODT) 4 MG disintegrating tablet Take 1 tablet (4 mg total) by mouth every 8 (eight) hours as needed for nausea or vomiting. 04/14/20   Carrie Mew, MD  oxyCODONE-acetaminophen (PERCOCET) 5-325 MG tablet Take 1 tablet by mouth every 4 (four) hours as needed. 04/15/20 04/15/21  Gregor Hams, MD  tamsulosin [facility]) 0.4 MG CAPS capsule Take 1 capsule (0.4 mg total) by mouth daily after breakfast. 04/15/20   Gregor Hams, MD    Allergies Patient has no known allergies.  No family history on file.  Social History Social History   Tobacco Use  . Smoking status: Never Smoker  . Smokeless tobacco: Never Used  Substance Use Topics  . Alcohol use: Never  . Drug use: Never    Review of Systems  Constitutional: No fever/chills Eyes: No visual changes. ENT: No sore throat. Cardiovascular: Denies chest pain. Respiratory: Denies shortness of breath. Gastrointestinal: No abdominal pain.  Positive for flank pain.  No nausea, no vomiting.  No diarrhea.  No constipation. Genitourinary: Negative for dysuria. Musculoskeletal: Negative for back pain. Skin: Negative for rash. Neurological: Negative for headaches, focal weakness or numbness.  ____________________________________________   PHYSICAL EXAM:  VITAL SIGNS: ED Triage Vitals [04/19/20 2322]  Enc Vitals Group     BP (!) 156/105     Pulse Rate (!) 105     Resp 18     Temp 98.6 F (37 C)  Temp Source Oral     SpO2 99 %     Weight 220 lb (99.8 kg)     Height 5\' 10"  (1.778 m)     Head Circumference      Peak Flow      Pain Score 9     Pain Loc      Pain Edu?      Excl. in GC?     Constitutional: Alert and oriented. Eyes: Conjunctivae are normal. Head: Atraumatic. Nose: No congestion/rhinnorhea. Mouth/Throat: Mucous membranes are moist. Neck: Normal ROM Cardiovascular: Normal rate, regular rhythm. Grossly normal heart sounds. Respiratory: Normal respiratory  effort.  No retractions. Lungs CTAB. Gastrointestinal: Soft and nontender.  No CVA tenderness bilaterally.  No distention. Genitourinary: deferred Musculoskeletal: No lower extremity tenderness nor edema. Neurologic:  Normal speech and language. No gross focal neurologic deficits are appreciated. Skin:  Skin is warm, dry and intact. No rash noted. Psychiatric: Mood and affect are normal. Speech and behavior are normal.  ____________________________________________   LABS (all labs ordered are listed, but only abnormal results are displayed)  Labs Reviewed  BASIC METABOLIC PANEL - Abnormal; Notable for the following components:      Result Value   Glucose, Bld 102 (*)    Creatinine, Ser 1.85 (*)    GFR calc non Af Amer 48 (*)    GFR calc Af Amer 56 (*)    All other components within normal limits  URINALYSIS, COMPLETE (UACMP) WITH MICROSCOPIC - Abnormal; Notable for the following components:   Hgb urine dipstick LARGE (*)    Ketones, ur 15 (*)    Protein, ur TRACE (*)    Bacteria, UA RARE (*)    All other components within normal limits  CBC WITH DIFFERENTIAL/PLATELET     PROCEDURES  Procedure(s) performed (including Critical Care):  Procedures   ____________________________________________   INITIAL IMPRESSION / ASSESSMENT AND PLAN / ED COURSE       29 year old male presents to the ED with ongoing flank pain following recent diagnosis of kidney stone followed by lithotripsy.  He arrived in severe pain but this is now improved following dose of IV morphine.  Lab work remarkable for mild AKI and he was hydrated with IV fluids.  We will further assess positioning of stone with KUB, check UA for evidence of infection.  UA without evidence of infection, KUB shows unchanged positioning of left sided stone.  Patient has some mild recurrent pain that we will treat with Toradol, but pain is overall well controlled at this time.  Case discussed with Dr. 26 of urology, who  agrees with plan for discharge home and will be available for possible ureteroscopy tomorrow.  Patient counseled to continue p.o. Toradol in addition to the previously prescribed Dilaudid.  He was counseled to return to the ED for new worsening symptoms, patient agrees with plan.      ____________________________________________   FINAL CLINICAL IMPRESSION(S) / ED DIAGNOSES  Final diagnoses:  Left flank pain  Nephrolithiasis     ED Discharge Orders    None       Note:  This document was prepared using Dragon voice recognition software and may include unintentional dictation errors.   Lonna Cobb, MD 04/20/20 [phone]

## 2020-04-20 NOTE — Telephone Encounter (Signed)
Will need orders-

## 2020-04-20 NOTE — Telephone Encounter (Signed)
Patient reports pain medication has improved pain level since going to the emergency room without periods of severe pain. He is concerned about having another flare up like happened in the night. He was told in the emergency room that the stone hasn't moved since lithotripsy. Please advise.

## 2020-04-21 ENCOUNTER — Ambulatory Visit: Payer: 59

## 2020-04-21 ENCOUNTER — Encounter: Payer: Self-pay | Admitting: Urology

## 2020-04-21 ENCOUNTER — Ambulatory Visit: Payer: 59 | Admitting: Anesthesiology

## 2020-04-21 ENCOUNTER — Ambulatory Visit
Admission: RE | Admit: 2020-04-21 | Discharge: 2020-04-21 | Disposition: A | Discharge status: Home / Self Care | Payer: 59 | Attending: Urology | Admitting: Urology

## 2020-04-21 ENCOUNTER — Other Ambulatory Visit: Payer: Self-pay

## 2020-04-21 ENCOUNTER — Encounter
Admission: RE | Disposition: A | Discharge status: Home / Self Care | Payer: Self-pay | Source: Home / Self Care | Attending: Urology

## 2020-04-21 DIAGNOSIS — N201 Calculus of ureter: Secondary | ICD-10-CM | POA: Diagnosis not present

## 2020-04-21 DIAGNOSIS — E669 Obesity, unspecified: Secondary | ICD-10-CM | POA: Insufficient documentation

## 2020-04-21 DIAGNOSIS — Z87442 Personal history of urinary calculi: Secondary | ICD-10-CM | POA: Diagnosis not present

## 2020-04-21 DIAGNOSIS — N132 Hydronephrosis with renal and ureteral calculous obstruction: Secondary | ICD-10-CM | POA: Diagnosis not present

## 2020-04-21 DIAGNOSIS — Z6831 Body mass index (BMI) 31.0-31.9, adult: Secondary | ICD-10-CM | POA: Diagnosis not present

## 2020-04-21 HISTORY — PX: CYSTOSCOPY W/ RETROGRADES: SHX1426

## 2020-04-21 HISTORY — PX: CYSTOSCOPY WITH URETEROSCOPY, STONE BASKETRY AND STENT PLACEMENT: SHX6378

## 2020-04-21 SURGERY — CYSTOSCOPY, WITH CALCULUS MANIPULATION OR REMOVAL
Anesthesia: General | Site: Ureter | Laterality: Left

## 2020-04-21 MED ORDER — PROMETHAZINE HCL 25 MG/ML IJ SOLN: 6.2500 mg | INTRAMUSCULAR | Status: DC | PRN

## 2020-04-21 MED ORDER — MEPERIDINE HCL 50 MG/ML IJ SOLN: 6.2500 mg | INTRAMUSCULAR | Status: DC | PRN

## 2020-04-21 MED ORDER — OXYBUTYNIN CHLORIDE 5 MG PO TABS
ORAL_TABLET | ORAL | 0 refills | Status: DC
Start: 2020-04-21 — End: 2020-04-30

## 2020-04-21 MED ORDER — DEXAMETHASONE SODIUM PHOSPHATE 10 MG/ML IJ SOLN
INTRAMUSCULAR | Status: DC | PRN
  Administered 2020-04-21: 10 mg via INTRAVENOUS

## 2020-04-21 MED ORDER — GLYCOPYRROLATE 0.2 MG/ML IJ SOLN
INTRAMUSCULAR | Status: AC
  Filled 2020-04-21: qty 1

## 2020-04-21 MED ORDER — FENTANYL CITRATE (PF) 100 MCG/2ML IJ SOLN
INTRAMUSCULAR | Status: DC | PRN
  Administered 2020-04-21: 25 ug via INTRAVENOUS
  Administered 2020-04-21: 50 ug via INTRAVENOUS

## 2020-04-21 MED ORDER — FAMOTIDINE 20 MG PO TABS
ORAL_TABLET | ORAL | Status: AC
  Administered 2020-04-21: 20 mg via ORAL
  Filled 2020-04-21: qty 1

## 2020-04-21 MED ORDER — LIDOCAINE HCL (PF) 2 % IJ SOLN
INTRAMUSCULAR | Status: AC
  Filled 2020-04-21: qty 5

## 2020-04-21 MED ORDER — LIDOCAINE HCL (CARDIAC) PF 100 MG/5ML IV SOSY
PREFILLED_SYRINGE | INTRAVENOUS | Status: DC | PRN
  Administered 2020-04-21: 100 mg via INTRAVENOUS

## 2020-04-21 MED ORDER — OXYCODONE HCL 5 MG/5ML PO SOLN: 5.0000 mg | Freq: Once | ORAL | Status: DC | PRN

## 2020-04-21 MED ORDER — PROPOFOL 10 MG/ML IV BOLUS
INTRAVENOUS | Status: DC | PRN
  Administered 2020-04-21: 200 mg via INTRAVENOUS

## 2020-04-21 MED ORDER — PROPOFOL 10 MG/ML IV BOLUS
INTRAVENOUS | Status: AC
  Filled 2020-04-21: qty 20

## 2020-04-21 MED ORDER — FENTANYL CITRATE (PF) 100 MCG/2ML IJ SOLN
INTRAMUSCULAR | Status: AC
  Administered 2020-04-21: 25 ug via INTRAVENOUS
  Filled 2020-04-21: qty 2

## 2020-04-21 MED ORDER — ONDANSETRON HCL 4 MG/2ML IJ SOLN
INTRAMUSCULAR | Status: DC | PRN
  Administered 2020-04-21: 4 mg via INTRAVENOUS

## 2020-04-21 MED ORDER — FAMOTIDINE 20 MG PO TABS: 20.0000 mg | ORAL_TABLET | Freq: Once | ORAL | Status: AC

## 2020-04-21 MED ORDER — CEFAZOLIN SODIUM-DEXTROSE 2-4 GM/100ML-% IV SOLN
2.0000 g | INTRAVENOUS | Status: AC
  Administered 2020-04-21: 2 g via INTRAVENOUS

## 2020-04-21 MED ORDER — ONDANSETRON HCL 4 MG/2ML IJ SOLN
INTRAMUSCULAR | Status: AC
  Filled 2020-04-21: qty 2

## 2020-04-21 MED ORDER — FENTANYL CITRATE (PF) 100 MCG/2ML IJ SOLN
25.0000 ug | INTRAMUSCULAR | Status: DC | PRN
  Administered 2020-04-21: 50 ug via INTRAVENOUS
  Administered 2020-04-21: 25 ug via INTRAVENOUS

## 2020-04-21 MED ORDER — OXYCODONE HCL 5 MG PO TABS: 5.0000 mg | ORAL_TABLET | Freq: Once | ORAL | Status: DC | PRN

## 2020-04-21 MED ORDER — PHENYLEPHRINE HCL (PRESSORS) 10 MG/ML IV SOLN
INTRAVENOUS | Status: DC | PRN
  Administered 2020-04-21: 100 ug via INTRAVENOUS

## 2020-04-21 MED ORDER — GLYCOPYRROLATE 0.2 MG/ML IJ SOLN
INTRAMUSCULAR | Status: DC | PRN
  Administered 2020-04-21: .2 mg via INTRAVENOUS

## 2020-04-21 MED ORDER — DEXAMETHASONE SODIUM PHOSPHATE 10 MG/ML IJ SOLN
INTRAMUSCULAR | Status: AC
  Filled 2020-04-21: qty 1

## 2020-04-21 MED ORDER — MIDAZOLAM HCL 2 MG/2ML IJ SOLN
INTRAMUSCULAR | Status: DC | PRN
  Administered 2020-04-21: 2 mg via INTRAVENOUS

## 2020-04-21 MED ORDER — IOHEXOL 180 MG/ML  SOLN
INTRAMUSCULAR | Status: DC | PRN
  Administered 2020-04-21: 20 mL

## 2020-04-21 MED ORDER — EPHEDRINE SULFATE 50 MG/ML IJ SOLN
INTRAMUSCULAR | Status: DC | PRN
  Administered 2020-04-21: 10 mg via INTRAVENOUS

## 2020-04-21 MED ORDER — FENTANYL CITRATE (PF) 100 MCG/2ML IJ SOLN
INTRAMUSCULAR | Status: AC
  Filled 2020-04-21: qty 2

## 2020-04-21 MED ORDER — LACTATED RINGERS IV SOLN: INTRAVENOUS | Status: DC

## 2020-04-21 MED ORDER — CEFAZOLIN SODIUM-DEXTROSE 2-4 GM/100ML-% IV SOLN
INTRAVENOUS | Status: AC
  Filled 2020-04-21: qty 100

## 2020-04-21 MED ORDER — MIDAZOLAM HCL 2 MG/2ML IJ SOLN
INTRAMUSCULAR | Status: AC
  Filled 2020-04-21: qty 2

## 2020-04-21 SURGICAL SUPPLY — 27 items
BAG DRAIN CYSTO-URO LG1000N (MISCELLANEOUS) ×4 IMPLANT
BASKET ZERO TIP 1.9FR (BASKET) ×4 IMPLANT
BRUSH SCRUB EZ 1% IODOPHOR (MISCELLANEOUS) ×4 IMPLANT
CATH URETL 5X70 OPEN END (CATHETERS) IMPLANT
CNTNR SPEC 2.5X3XGRAD LEK (MISCELLANEOUS)
CONT SPEC 4OZ STER OR WHT (MISCELLANEOUS)
CONTAINER SPEC 2.5X3XGRAD LEK (MISCELLANEOUS) IMPLANT
DRAPE UTILITY 15X26 TOWEL STRL (DRAPES) ×4 IMPLANT
GLOVE BIO SURGEON STRL SZ8 (GLOVE) ×4 IMPLANT
GOWN STRL REUS W/ TWL LRG LVL3 (GOWN DISPOSABLE) ×2 IMPLANT
GOWN STRL REUS W/ TWL XL LVL3 (GOWN DISPOSABLE) ×2 IMPLANT
GOWN STRL REUS W/TWL LRG LVL3 (GOWN DISPOSABLE) ×2
GOWN STRL REUS W/TWL XL LVL3 (GOWN DISPOSABLE) ×2
GUIDEWIRE STR DUAL SENSOR (WIRE) ×8 IMPLANT
INFUSOR MANOMETER BAG 3000ML (MISCELLANEOUS) ×4 IMPLANT
INTRODUCER DILATOR DOUBLE (INTRODUCER) IMPLANT
KIT TURNOVER CYSTO (KITS) ×4 IMPLANT
PACK CYSTO AR (MISCELLANEOUS) ×4 IMPLANT
SET CYSTO W/LG BORE CLAMP LF (SET/KITS/TRAYS/PACK) ×4 IMPLANT
SHEATH URETERAL 12FRX35CM (MISCELLANEOUS) IMPLANT
SOL .9 NS 3000ML IRR  AL (IV SOLUTION) ×2
SOL .9 NS 3000ML IRR UROMATIC (IV SOLUTION) ×2 IMPLANT
STENT URET 6FRX24 CONTOUR (STENTS) IMPLANT
STENT URET 6FRX26 CONTOUR (STENTS) ×4 IMPLANT
SURGILUBE 2OZ TUBE FLIPTOP (MISCELLANEOUS) ×4 IMPLANT
VALVE UROSEAL ADJ ENDO (VALVE) IMPLANT
WATER STERILE IRR 1000ML POUR (IV SOLUTION) ×4 IMPLANT

## 2020-04-21 NOTE — Transfer of Care (Signed)
Immediate Anesthesia Transfer of Care Note  Patient: Jacob Payne  Procedure(s) Performed: Procedure(s): CYSTOSCOPY WITH URETEROSCOPY, STONE BASKETRY AND STENT PLACEMENT (Left) CYSTOSCOPY WITH RETROGRADE PYELOGRAM (Left)  Patient Location: PACU  Anesthesia Type:General  Level of Consciousness: sedated  Airway & Oxygen Therapy: Patient Spontanous Breathing and Patient connected to face mask oxygen  Post-op Assessment: Report given to RN and Post -op Vital signs reviewed and stable  Post vital signs: Reviewed and stable  Last Vitals:  Vitals:   04/21/20 1420 04/21/20 1640  BP: (!) 125/91 112/64  Pulse: 78 83  Resp: 16 11  Temp: 37.3 C (!) 36 C  SpO2: 100% 98%    Complications: No apparent anesthesia complications

## 2020-04-21 NOTE — Op Note (Signed)
Preoperative diagnosis: Left distal ureteral calculus  Postoperative diagnosis: Left distal ureteral calculus  Procedure:  1. Cystoscopy 2. Left ureteroscopy and stone removal 3. Left ureteral stent placement (6FR) 26 cm 4. Left retrograde pyelography with interpretation  Surgeon: Lorin Picket C. Gia Lusher, M.D.  Anesthesia: General  Complications: None  Intraoperative findings:  1.  Left retrograde pyelography post procedure showed no filling defects, stone fragments or contrast extravasation  2.  Edema of the distal ureter with stone visualized just proximal to this area  EBL: Minimal  Specimens: 1. Calculus for analysis   Indication: Jacob Payne is a 29 y.o. male who underwent shockwave lithotripsy of a 3 mm left distal ureteral calculus on 04/16/2020.  He has had intermittent pain since treatment including 1 ED visit and has been passed the stone.  He has elected ureteroscopic stone removal. . After reviewing the management options for treatment, the patient elected to proceed with the above surgical procedure(s). We have discussed the potential benefits and risks of the procedure, side effects of the proposed treatment, the likelihood of the patient achieving the goals of the procedure, and any potential problems that might occur during the procedure or recuperation. Informed consent has been obtained.  Description of procedure:  The patient was taken to the operating room and general anesthesia was induced.  The patient was placed in the dorsal lithotomy position, prepped and draped in the usual sterile fashion, and preoperative antibiotics were administered. A preoperative time-out was performed.   A 21 French cystoscope was lubricated and passed under direct vision.  The urethra was normal in caliber without stricture.  The prostate was nonocclusive.  Panendoscopy was performed and the bladder mucosa showed no erythema, solid or papillary lesions.  No edema of the left  ureteral orifice was noted and clear efflux was noted bilaterally.  Attention was directed to the left ureteral orifice and a 0.038 Sensor wire was then advanced up the ureter into the renal pelvis under fluoroscopic guidance.  A 4.5 Fr semirigid ureteroscope was then advanced into the ureter without difficulty.  Within 2 cm of the distal ureter there was compression which impeded passage of the ureteroscope.  A second sensor wire was placed through the ureteroscope and advanced proximally.  The ureteroscope was then carefully advanced over the wire to negotiate this area.  No stricture was identified however ureteral edema was noted and the calculus was identified just proximal to the narrowed area.  The calculus was placed in a 1.9 French nitinol basket and removed without difficulty.  The ureteroscope was repassed and retrograde pyelogram was performed through the ureteroscope with findings as described above.  The ureteroscope was removed and a 6FR/26 cm Contour ureteral stent was placed under fluoroscopic guidance.  The wire was then removed with an adequate stent curl noted in the renal pelvis as well as in the bladder.  The bladder was then emptied and the procedure ended.  The patient appeared to tolerate the procedure well and without complications.  After anesthetic reversal the patient was transported to the PACU in stable condition.   Plan: The stent was left attached to a tether and the patient was instructed to remove on Friday, 04/24/2020.   Irineo Axon, MD

## 2020-04-21 NOTE — Anesthesia Procedure Notes (Signed)
Procedure Name: LMA Insertion Date/Time: 04/21/2020 3:56 PM Performed by: Stormy Fabian, CRNA Pre-anesthesia Checklist: Patient identified, Patient being monitored, Timeout performed, Emergency Drugs available and Suction available Patient Re-evaluated:Patient Re-evaluated prior to induction Oxygen Delivery Method: Circle system utilized Preoxygenation: Pre-oxygenation with 100% oxygen Induction Type: IV induction Ventilation: Mask ventilation without difficulty LMA: LMA inserted LMA Size: 3.5 Tube type: Oral Number of attempts: 1 Placement Confirmation: positive ETCO2 and breath sounds checked- equal and bilateral Tube secured with: Tape Dental Injury: Teeth and Oropharynx as per pre-operative assessment

## 2020-04-21 NOTE — Progress Notes (Addendum)
[facility] visited pt. briefly in same day surgery pre-op; pt. about to be taken to surgery for ureterscopy to treat kidney stone; pt. said he had been praying a lot, but overall was doing OK and had no needs at this time; [facility] is available if needed.    04/21/20 1500  Clinical Encounter Type  Visited With Patient  Visit Type Initial;Pre-op;Psychological support  Referral From  (Routine Rounding)  Stress Factors  Patient Stress Factors Health changes;Lack of knowledge

## 2020-04-21 NOTE — Discharge Instructions (Addendum)
DISCHARGE INSTRUCTIONS FOR KIDNEY STONE/URETERAL STENT   MEDICATIONS:  1. Resume all your other meds from home.  2.  AZO (over-the-counter) can help with the burning/stinging when you urinate. 3.  Oxybutynin is for stent irritation.  Rx was sent to your pharmacy.  ACTIVITY:  1. May resume regular activities in 24 hours. 2. No driving while on narcotic pain medications  3. Drink plenty of water  4. Continue to walk at home - you can still get blood clots when you are at home, so keep active, but don't over do it.  5. May return to work/school tomorrow or when you feel ready   BATHING:  1. You can shower. 2. You have a string coming from your urethra: The stent string is attached to your ureteral stent. Do not pull on this.   SIGNS/SYMPTOMS TO CALL:  Please call us if you have a fever greater than 101.5, uncontrolled nausea/vomiting, uncontrolled pain, dizziness, unable to urinate, excessively bloody urine, chest pain, shortness of breath, leg swelling, leg pain, or any other concerns or questions.   Common postoperative symptoms include urinary frequency, urgency, bladder spasms.  Blood in the urine is normal.  You can reach [place] at [phone].   FOLLOW-UP:  1. You have a follow-up appointment scheduled 04/30/2020 2. You have a string attached to your stent, you may remove it on Friday, 04/24/2020. To do this, pull the string until the stent is completely removed. You may feel an odd sensation in your back.  If you do not feel comfortable removing the stent then please contact the office in the time will be arranged for you to come in to have removed.    AMBULATORY SURGERY  DISCHARGE INSTRUCTIONS   1) The drugs that you were given will stay in your system until tomorrow so for the next 24 hours you should not:  A) Drive an automobile B) Make any legal decisions C) Drink any alcoholic beverage   2) You may resume regular meals tomorrow.  Today it is better to start with liquids  and gradually work up to solid foods.  You may eat anything you prefer, but it is better to start with liquids, then soup and crackers, and gradually work up to solid foods.   3) Please notify your doctor immediately if you have any unusual bleeding, trouble breathing, redness and pain at the surgery site, drainage, fever, or pain not relieved by medication.    4) Additional Instructions:        Please contact your physician with any problems or Same Day Surgery at [phone], Monday through Friday 6 am to 4 pm, or [place] [facility] number at [phone].

## 2020-04-21 NOTE — Anesthesia Preprocedure Evaluation (Signed)
Anesthesia Evaluation  Patient identified by MRN, date of birth, ID band Patient awake    Reviewed: Allergy & Precautions, NPO status , Patient's Chart, lab work & pertinent test results  History of Anesthesia Complications Negative for: history of anesthetic complications  Airway Mallampati: II  TM Distance: >3 FB Neck ROM: Full    Dental no notable dental hx.    Pulmonary neg pulmonary ROS, neg sleep apnea, neg COPD,    breath sounds clear to auscultation- rhonchi (-) wheezing      Cardiovascular Exercise Tolerance: Good (-) hypertension(-) CAD and (-) Past MI  Rhythm:Regular Rate:Normal - Systolic murmurs and - Diastolic murmurs    Neuro/Psych negative neurological ROS  negative psych ROS   GI/Hepatic negative GI ROS, Neg liver ROS,   Endo/Other  negative endocrine ROSneg diabetes  Renal/GU Renal disease: nephrolithiasis.     Musculoskeletal negative musculoskeletal ROS (+)   Abdominal (+) + obese,   Peds  Hematology negative hematology ROS (+)   Anesthesia Other Findings   Reproductive/Obstetrics                             Anesthesia Physical Anesthesia Plan  ASA: II  Anesthesia Plan: General   Post-op Pain Management:    Induction: Intravenous  PONV Risk Score and Plan: 1 and Ondansetron, Dexamethasone and Midazolam  Airway Management Planned: LMA  Additional Equipment:   Intra-op Plan:   Post-operative Plan:   Informed Consent: I have reviewed the patients History and Physical, chart, labs and discussed the procedure including the risks, benefits and alternatives for the proposed anesthesia with the patient or authorized representative who has indicated his/her understanding and acceptance.     Dental advisory given  Plan Discussed with: CRNA and Anesthesiologist  Anesthesia Plan Comments:         Anesthesia Quick Evaluation

## 2020-04-21 NOTE — Interval H&P Note (Signed)
History and Physical Interval Note: Jacob Payne underwent shockwave lithotripsy of a 3 mm left distal ureteral calculus on 04/16/2020.  He has had intermittent pain since treatment including 1 ED visit and has been passed the stone.  He has elected ureteroscopic stone removal.  The procedure has been discussed in detail including potential risks of bleeding, infection and ureteral injury.  The need for a postoperative ureteral stent was also discussed and we discussed stent pain.  He indicated all questions were answered and desires to proceed.  04/21/2020 3:41 PM  Jacob Payne  has presented today for surgery, with the diagnosis of left ureteral stone.  The various methods of treatment have been discussed with the patient and family. After consideration of risks, benefits and other options for treatment, the patient has consented to  Procedure(s): CYSTOSCOPY/URETEROSCOPY/HOLMIUM LASER/STENT PLACEMENT (Left) as a surgical intervention.  The patient's history has been reviewed, patient examined, no change in status, stable for surgery.  I have reviewed the patient's chart and labs.  Questions were answered to the patient's satisfaction.     Zamya Culhane C Margrett Kalb

## 2020-04-22 ENCOUNTER — Telehealth: Payer: Self-pay

## 2020-04-22 NOTE — Anesthesia Postprocedure Evaluation (Signed)
Anesthesia Post Note  Patient: Jacob Payne  Procedure(s) Performed: CYSTOSCOPY WITH URETEROSCOPY, STONE BASKETRY AND STENT PLACEMENT (Left Ureter) CYSTOSCOPY WITH RETROGRADE PYELOGRAM (Left Ureter)  Patient location during evaluation: PACU Anesthesia Type: General Level of consciousness: awake and alert Pain management: pain level controlled Vital Signs Assessment: post-procedure vital signs reviewed and stable Respiratory status: spontaneous breathing, nonlabored ventilation, respiratory function stable and patient connected to nasal cannula oxygen Cardiovascular status: blood pressure returned to baseline and stable Postop Assessment: no apparent nausea or vomiting Anesthetic complications: no     Last Vitals:  Vitals:   04/21/20 1748 04/21/20 1750  BP: 109/62 109/62  Pulse:  86  Resp: 14 18  Temp:  (!) 36.1 C  SpO2:  100%    Last Pain:  Vitals:   04/21/20 1750  TempSrc: Temporal  PainSc: 1                  Corinda Gubler

## 2020-04-22 NOTE — Telephone Encounter (Signed)
Patient called stating that he is almost out of toradol and wanted to know if he could have a refill. He states he is not in pain now. He was instructed to try OTC NSAIDS to help with pain and to keep taking the oxybutinin and tamsulosin until he removes stent on Friday. If OTC pain medications do not help with pain he may call back and request a refill. Patient verbalized understanding, he also states he has not had a bowel movement in 5 days. He was instructed to try stool softeners or laxatives and increase water intake to help with this

## 2020-04-24 ENCOUNTER — Other Ambulatory Visit: Payer: Self-pay

## 2020-04-24 ENCOUNTER — Ambulatory Visit (INDEPENDENT_AMBULATORY_CARE_PROVIDER_SITE_OTHER): Payer: 59 | Admitting: Urology

## 2020-04-24 DIAGNOSIS — N200 Calculus of kidney: Secondary | ICD-10-CM

## 2020-04-26 NOTE — Progress Notes (Signed)
04/24/2020 4:11 PM   Jacob Payne 07-Jul-1991 [id number]  Referring provider: No referring provider defined for this encounter.  Chief Complaint  Patient presents with  . Stent removal    HPI: Jacob Payne is a 29 year old male with nephrolithiasis who presents today for ureteral stent removal.  CT Renal stone study 04/14/2020 3 mm left distal ureteral calculus causing mild left hydronephrosis.    He failed a trial of passage and underwent left ESWL with Dr. Lonna Cobb on 04/16/2020.  He had difficulty passing fragments, so he underwent left ureteroscopy with Dr. Lonna Cobb on 04/21/2020.    The stent was left attached to a tether, but he did not feel comfortable removing it himself.  PMH: No past medical history on file.  Surgical History: Past Surgical History:  Procedure Laterality Date  . APPENDECTOMY    . CYSTOSCOPY W/ RETROGRADES Left 04/21/2020   Procedure: CYSTOSCOPY WITH RETROGRADE PYELOGRAM;  Surgeon: Riki Altes, MD;  Location: [facility];  Service: Urology;  Laterality: Left;  . CYSTOSCOPY WITH URETEROSCOPY, STONE BASKETRY AND STENT PLACEMENT Left 04/21/2020   Procedure: CYSTOSCOPY WITH URETEROSCOPY, STONE BASKETRY AND STENT PLACEMENT;  Surgeon: Riki Altes, MD;  Location: [facility];  Service: Urology;  Laterality: Left;  . EXTRACORPOREAL SHOCK WAVE LITHOTRIPSY Left 04/16/2020   Procedure: EXTRACORPOREAL SHOCK WAVE LITHOTRIPSY (ESWL);  Surgeon: Riki Altes, MD;  Location: [facility];  Service: Urology;  Laterality: Left;    Home Medications:  Allergies as of 04/24/2020   No Known Allergies     Medication List       Accurate as of Apr 24, 2020 11:59 PM. If you have any questions, ask your nurse or doctor.        HYDROmorphone 2 MG tablet Commonly known as: Dilaudid Take 1 tablet (2 mg total) by mouth every 4 (four) hours as needed for severe pain.   ketorolac 10 MG tablet Commonly known as: TORADOL Take 1 tablet (10 mg total) by mouth every 6 (six)  hours as needed for moderate pain.   ondansetron 4 MG disintegrating tablet Commonly known as: Zofran ODT Take 1 tablet (4 mg total) by mouth every 8 (eight) hours as needed for nausea or vomiting.   oxybutynin 5 MG tablet Commonly known as: DITROPAN 1 tab tid prn frequency,urgency, bladder spasm   tamsulosin 0.4 MG Caps capsule Commonly known as: Flomax Take 1 capsule (0.4 mg total) by mouth daily after breakfast.       Allergies: No Known Allergies  Family History: No family history on file.  Social History:  reports that he has never smoked. He has never used smokeless tobacco. He reports that he does not drink alcohol or use drugs.  ROS: Pertinent ROS in HPI  Physical Exam: There were no vitals taken for this visit.  Constitutional:  Well nourished. Alert and oriented, No acute distress. HEENT: [place] AT, moist mucus membranes.  Trachea midline, no masses. Cardiovascular: No clubbing, cyanosis, or edema. Respiratory: Normal respiratory effort, no increased work of breathing. Neurologic: Grossly intact, no focal deficits, moving all 4 extremities. Psychiatric: Normal mood and affect.  Laboratory Data: Lab Results  Component Value Date   WBC 8.3 04/19/2020   HGB 16.2 04/19/2020   HCT 48.0 04/19/2020   MCV 88.6 04/19/2020   PLT 223 04/19/2020    Lab Results  Component Value Date   CREATININE 1.85 (H) 04/19/2020    Lab Results  Component Value Date   AST 37 04/14/2020  Lab Results  Component Value Date   ALT 60 (H) 04/14/2020    Urinalysis    Component Value Date/Time   COLORURINE YELLOW 04/20/2020 0055   APPEARANCEUR CLEAR 04/20/2020 0055   LABSPEC 1.025 04/20/2020 0055   PHURINE 5.5 04/20/2020 0055   GLUCOSEU NEGATIVE 04/20/2020 0055   HGBUR LARGE (A) 04/20/2020 0055   BILIRUBINUR NEGATIVE 04/20/2020 0055   KETONESUR 15 (A) 04/20/2020 0055   PROTEINUR TRACE (A) 04/20/2020 0055   NITRITE NEGATIVE 04/20/2020 0055   LEUKOCYTESUR NEGATIVE 04/20/2020  0055    I have reviewed the labs.  Procedure Note Stent is grasped by the tether and extracted successfully.   Pertinent Imaging: No imaging since procedure  Assessment & Plan:    1. Nephrolithiasis - s/p left URS - stent removed  Return for Keep follow up on 04/30/2020 with me .  These notes generated with voice recognition software. I apologize for typographical errors.  Zara Council, PA-C  [facility] [address]  [place], [place] [address] [phone]

## 2020-04-27 LAB — CALCULI, WITH PHOTOGRAPH (CLINICAL LAB)
Calcium Oxalate Dihydrate: 90 %
Calcium Oxalate Monohydrate: 10 %
Weight Calculi: 6 mg

## 2020-04-30 ENCOUNTER — Ambulatory Visit
Admission: RE | Admit: 2020-04-30 | Discharge: 2020-04-30 | Disposition: A | Discharge status: Home / Self Care | Payer: 59 | Attending: Urology | Admitting: Urology

## 2020-04-30 ENCOUNTER — Encounter: Payer: Self-pay | Admitting: Urology

## 2020-04-30 ENCOUNTER — Ambulatory Visit
Admission: RE | Admit: 2020-04-30 | Discharge: 2020-04-30 | Disposition: A | Discharge status: Home / Self Care | Payer: 59 | Source: Ambulatory Visit | Attending: Urology | Admitting: Urology

## 2020-04-30 ENCOUNTER — Ambulatory Visit (INDEPENDENT_AMBULATORY_CARE_PROVIDER_SITE_OTHER): Payer: 59 | Admitting: Urology

## 2020-04-30 ENCOUNTER — Other Ambulatory Visit: Payer: Self-pay

## 2020-04-30 VITALS — BP 136/93 | HR 76 | Ht 70.0 in | Wt 215.0 lb

## 2020-04-30 DIAGNOSIS — N201 Calculus of ureter: Secondary | ICD-10-CM | POA: Diagnosis not present

## 2020-04-30 DIAGNOSIS — R300 Dysuria: Secondary | ICD-10-CM

## 2020-04-30 LAB — URINALYSIS, COMPLETE
Bilirubin, UA: NEGATIVE
Glucose, UA: NEGATIVE
Ketones, UA: NEGATIVE
Leukocytes,UA: NEGATIVE
Nitrite, UA: NEGATIVE
Protein,UA: NEGATIVE
Specific Gravity, UA: <1.005 — ABNORMAL LOW (ref 1.005–1.030)
Urobilinogen, Ur: 0.2 mg/dL (ref 0.2–1.0)
pH, UA: 5 (ref 5.0–7.5)

## 2020-04-30 LAB — MICROSCOPIC EXAMINATION
Bacteria, UA: NONE SEEN
Epithelial Cells (non renal): NONE SEEN /hpf (ref 0–10)

## 2020-04-30 NOTE — Progress Notes (Signed)
04/30/2020 7:07 AM   Jacob Payne 06/02/1991 [id number]  Referring provider: No referring provider defined for this encounter.  Chief Complaint  Patient presents with  . Nephrolithiasis    HPI: Jacob Payne is a 29 year old male with nephrolithiasis who presents today for follow up.    CT Renal stone study 04/14/2020 3 mm left distal ureteral calculus causing mild left hydronephrosis.    He failed a trial of passage and underwent left ESWL with Dr. Bernardo Heater on 04/16/2020.  He had difficulty passing fragments, so he underwent left ureteroscopy with Dr. Bernardo Heater on 04/21/2020.    His stone composition is 90% calcium oxalate dihydrate and 10% calcium oxalate monohydrate.  The stent was left attached to a tether and removed in the office on 04/24/2020.  Today, he states he feels well.  He has had some episodes of flank pain, gross hematuria and burning with urination.  He has been improving.  Patient denies any modifying or aggravating factors.  Today, he denies any gross hematuria, dysuria or suprapubic/flank pain.  Patient denies any fevers, chills, nausea or vomiting.   UA is benign.    KUB 04/30/2020 no calculi visualized.   PMH: Past Medical History:  Diagnosis Date  . Elevated fasting glucose   . Elevated LFTs   . Overweight     Surgical History: Past Surgical History:  Procedure Laterality Date  . APPENDECTOMY    . CYSTOSCOPY W/ RETROGRADES Left 04/21/2020   Procedure: CYSTOSCOPY WITH RETROGRADE PYELOGRAM;  Surgeon: Abbie Sons, MD;  Location: [facility];  Service: Urology;  Laterality: Left;  . CYSTOSCOPY WITH URETEROSCOPY, STONE BASKETRY AND STENT PLACEMENT Left 04/21/2020   Procedure: CYSTOSCOPY WITH URETEROSCOPY, STONE BASKETRY AND STENT PLACEMENT;  Surgeon: Abbie Sons, MD;  Location: [facility];  Service: Urology;  Laterality: Left;  . EXTRACORPOREAL SHOCK WAVE LITHOTRIPSY Left 04/16/2020   Procedure: EXTRACORPOREAL SHOCK WAVE LITHOTRIPSY (ESWL);   Surgeon: Abbie Sons, MD;  Location: [facility];  Service: Urology;  Laterality: Left;    Home Medications:  Allergies as of 04/30/2020   No Known Allergies     Medication List       Accurate as of Apr 30, 2020 11:59 PM. If you have any questions, ask your nurse or doctor.        STOP taking these medications   HYDROmorphone 2 MG tablet Commonly known as: Dilaudid Stopped by: Anay Rathe, PA-C   ketorolac 10 MG tablet Commonly known as: TORADOL Stopped by: Deashia Soule, PA-C   ondansetron 4 MG disintegrating tablet Commonly known as: Zofran ODT Stopped by: Lason Eveland, PA-C   oxybutynin 5 MG tablet Commonly known as: DITROPAN Stopped by: Roney Youtz, PA-C   tamsulosin 0.4 MG Caps capsule Commonly known as: Flomax Stopped by: Zara Council, PA-C       Allergies: No Known Allergies  Family History: Family History  Problem Relation Age of Onset  . Diabetes Father   . Cancer Maternal Grandfather     Social History:  reports that he has never smoked. He has never used smokeless tobacco. He reports that he does not drink alcohol or use drugs.  ROS: Pertinent ROS in HPI  Physical Exam: BP (!) 136/93   Pulse 76   Ht 5\' 10"  (1.778 m)   Wt 215 lb (97.5 kg)   BMI 30.85 kg/m   Constitutional:  Well nourished. Alert and oriented, No acute distress. HEENT: [place] AT, mask in place.  Trachea midline Cardiovascular: No  clubbing, cyanosis, or edema. Respiratory: Normal respiratory effort, no increased work of breathing. Neurologic: Grossly intact, no focal deficits, moving all 4 extremities. Psychiatric: Normal mood and affect.  Laboratory Data: Lab Results  Component Value Date   WBC 4.8 05/12/2020   HGB 16.5 05/12/2020   HCT 49.6 05/12/2020   MCV 90 05/12/2020   PLT 179 05/12/2020    Lab Results  Component Value Date   CREATININE 1.17 05/12/2020    Lab Results  Component Value Date   AST 23 05/12/2020   Lab Results  Component  Value Date   ALT 39 05/12/2020    Urinalysis Component     Latest Ref Rng & Units 04/30/2020  Specific Gravity, UA     1.005 - 1.030 <1.005 (L)  pH, UA     5.0 - 7.5 5.0  Color, UA     Yellow Yellow  Appearance Ur     Clear Clear  Leukocytes,UA     Negative Negative  Protein,UA     Negative/Trace Negative  Glucose, UA     Negative Negative  Ketones, UA     Negative Negative  RBC, UA     Negative 2+ (A)  Bilirubin, UA     Negative Negative  Urobilinogen, Ur     0.2 - 1.0 mg/dL 0.2  Nitrite, UA     Negative Negative  Microscopic Examination      See below:   Component     Latest Ref Rng & Units 04/30/2020  WBC, UA     0 - 5 /hpf 0-5  RBC     4.14 - 5.80 x10E6/uL 0-2  Epithelial Cells (non renal)     0 - 10 /hpf None seen  Bacteria, UA     None seen/Few None seen    I have reviewed the labs.   Pertinent Imaging: CLINICAL DATA:  Gross hematuria 2 days ago. History of a left-sided kidney stone. No pain today.  EXAM: ABDOMEN - 1 VIEW  COMPARISON:  04/20/2020  FINDINGS: Normal bowel gas pattern. Stable bilateral pelvic phleboliths and suspected small distal left ureteral calculus. Otherwise, calcified urinary tract calculi seen. Unremarkable bones.  IMPRESSION: No acute abnormality. Stable bilateral pelvic phleboliths and possible small distal left ureteral calculus.   Electronically Signed   By: Beckie Salts M.D.   On: 04/30/2020 20:30 I have independently reviewed the films.  See HPI.   Assessment & Plan:    1. Dysuria - Resolved at this time - Urinalysis, Complete - clear  2. Nephrolithiasis Patient produced a calcium oxalate stone which is the most common type. He is given the ABCs of stone prevention handout and samples of LithoLyte. He is encouraged to limit his intake of M[place] Yellow to increase his water intake. We discussed pursuing a 24-hour metabolic work-up, but he would like to take some time to think about pursuing this and  will call back if he wishes to have that completed. We will obtain a renal ultrasound to ensure no iatrogenic hydronephrosis remains but I will contact the patient by phone for those results.     Return for I will call patient with results.  These notes generated with voice recognition software. I apologize for typographical errors.  Michiel Cowboy, PA-C  [facility] [address]  [address] [place], [place] [address] [phone]

## 2020-05-12 ENCOUNTER — Other Ambulatory Visit: Payer: Self-pay

## 2020-05-12 ENCOUNTER — Encounter: Payer: Self-pay | Admitting: Emergency Medicine

## 2020-05-12 ENCOUNTER — Ambulatory Visit: Payer: Self-pay | Admitting: Emergency Medicine

## 2020-05-12 VITALS — BP 134/100 | HR 88 | Temp 99.4°F | Resp 12 | Ht 70.0 in | Wt 228.0 lb

## 2020-05-12 DIAGNOSIS — Z0000 Encounter for general adult medical examination without abnormal findings: Secondary | ICD-10-CM

## 2020-05-12 NOTE — Progress Notes (Signed)
  [facility] Provider Note       Time seen: 9:26 AM    I have reviewed the vital signs and the nursing notes.  HISTORY   Chief Complaint Annual Exam and Labs Only    HPI Jacob Payne is a 29 y.o. male with no significant past medical history who presents today for physical examination and lab work.  Patient is due a cholesterol panel.  Recently seen for kidney stones and did have some diminished renal function.  Is following up for renal ultrasound.  Past Medical History:  Diagnosis Date  . Elevated fasting glucose   . Elevated LFTs   . Overweight     Past Surgical History:  Procedure Laterality Date  . APPENDECTOMY    . CYSTOSCOPY W/ RETROGRADES Left 04/21/2020   Procedure: CYSTOSCOPY WITH RETROGRADE PYELOGRAM;  Surgeon: Riki Altes, MD;  Location: [facility];  Service: Urology;  Laterality: Left;  . CYSTOSCOPY WITH URETEROSCOPY, STONE BASKETRY AND STENT PLACEMENT Left 04/21/2020   Procedure: CYSTOSCOPY WITH URETEROSCOPY, STONE BASKETRY AND STENT PLACEMENT;  Surgeon: Riki Altes, MD;  Location: [facility];  Service: Urology;  Laterality: Left;  . EXTRACORPOREAL SHOCK WAVE LITHOTRIPSY Left 04/16/2020   Procedure: EXTRACORPOREAL SHOCK WAVE LITHOTRIPSY (ESWL);  Surgeon: Riki Altes, MD;  Location: [facility];  Service: Urology;  Laterality: Left;    Allergies Patient has no known allergies.  Review of Systems Constitutional: Negative for fever. Cardiovascular: Negative for chest pain. Respiratory: Negative for shortness of breath. Gastrointestinal: Negative for abdominal pain, vomiting and diarrhea. Musculoskeletal: Negative for back pain. Skin: Negative for rash. Neurological: Negative for headaches, focal weakness or numbness.  All systems negative/normal/unremarkable except as stated in the HPI  ____________________________________________   PHYSICAL EXAM:  VITAL SIGNS: Vitals:   05/12/20 0911  BP: (!) 134/100   Pulse: 88  Resp: 12  Temp: 99.4 F (37.4 C)  SpO2: 98%    Constitutional: Alert and oriented. Well appearing and in no distress. Eyes: Conjunctivae are normal. Normal extraocular movements. ENT      Head: Normocephalic and atraumatic.      Nose: No congestion/rhinnorhea.      Mouth/Throat: Mucous membranes are moist.      Neck: No stridor. Cardiovascular: Normal rate, regular rhythm. No murmurs, rubs, or gallops. Respiratory: Normal respiratory effort without tachypnea nor retractions. Breath sounds are clear and equal bilaterally. No wheezes/rales/rhonchi. Gastrointestinal: Soft and nontender. Normal bowel sounds Musculoskeletal: Nontender with normal range of motion in extremities. No lower extremity tenderness nor edema. Neurologic:  Normal speech and language. No gross focal neurologic deficits are appreciated.  Skin:  Skin is warm, dry and intact. No rash noted.  DIFFERENTIAL DIAGNOSIS  Annual physical examination  ASSESSMENT AND PLAN  Annual physical examination   Plan: The patient had presented for annual physical. Patient's labs are still pending.  Initially to see if he is kidney functions have improved.  He is following up with urology.  Otherwise appears cleared for follow-up as needed.  Daryel November MD    Note: This note was generated in part or whole with voice recognition software. Voice recognition is usually quite accurate but there are transcription errors that can and very often do occur. I apologize for any typographical errors that were not detected and corrected.

## 2020-05-13 LAB — CMP12+LP+TP+TSH+6AC+CBC/D/PLT
ALT: 39 IU/L (ref 0–44)
AST: 23 IU/L (ref 0–40)
Albumin/Globulin Ratio: 1.9 (ref 1.2–2.2)
Albumin: 5 g/dL (ref 4.1–5.2)
Alkaline Phosphatase: 59 IU/L (ref 48–121)
BUN/Creatinine Ratio: 11 (ref 9–20)
BUN: 13 mg/dL (ref 6–20)
Basophils Absolute: 0 10*3/uL (ref 0.0–0.2)
Basos: 1 %
Bilirubin Total: 0.5 mg/dL (ref 0.0–1.2)
Calcium: 9.7 mg/dL (ref 8.7–10.2)
Chloride: 102 mmol/L (ref 96–106)
Chol/HDL Ratio: 4.5 ratio (ref 0.0–5.0)
Cholesterol, Total: 190 mg/dL (ref 100–199)
Creatinine, Ser: 1.17 mg/dL (ref 0.76–1.27)
EOS (ABSOLUTE): 0.2 10*3/uL (ref 0.0–0.4)
Eos: 3 %
Estimated CHD Risk: 0.9 times avg. (ref 0.0–1.0)
Free Thyroxine Index: 2.3 (ref 1.2–4.9)
GFR calc Af Amer: 98 mL/min/{1.73_m2} (ref >59)
GFR calc non Af Amer: 84 mL/min/{1.73_m2} (ref >59)
GGT: 19 IU/L (ref 0–65)
Globulin, Total: 2.6 g/dL (ref 1.5–4.5)
Glucose: 95 mg/dL (ref 65–99)
HDL: 42 mg/dL (ref >39)
Hematocrit: 49.6 % (ref 37.5–51.0)
Hemoglobin: 16.5 g/dL (ref 13.0–17.7)
Immature Grans (Abs): 0 10*3/uL (ref 0.0–0.1)
Immature Granulocytes: 0 %
Iron: 122 ug/dL (ref 38–169)
LDH: 176 IU/L (ref 121–224)
LDL Chol Calc (NIH): 117 mg/dL — ABNORMAL HIGH (ref 0–99)
Lymphocytes Absolute: 1.6 10*3/uL (ref 0.7–3.1)
Lymphs: 34 %
MCH: 30 pg (ref 26.6–33.0)
MCHC: 33.3 g/dL (ref 31.5–35.7)
MCV: 90 fL (ref 79–97)
Monocytes Absolute: 0.4 10*3/uL (ref 0.1–0.9)
Monocytes: 9 %
Neutrophils Absolute: 2.5 10*3/uL (ref 1.4–7.0)
Neutrophils: 53 %
Phosphorus: 3.2 mg/dL (ref 2.8–4.1)
Platelets: 179 10*3/uL (ref 150–450)
Potassium: 4.4 mmol/L (ref 3.5–5.2)
RBC: 5.5 x10E6/uL (ref 4.14–5.80)
RDW: 12.4 % (ref 11.6–15.4)
Sodium: 141 mmol/L (ref 134–144)
T3 Uptake Ratio: 26 % (ref 24–39)
T4, Total: 8.7 ug/dL (ref 4.5–12.0)
TSH: 2.46 u[IU]/mL (ref 0.450–4.500)
Total Protein: 7.6 g/dL (ref 6.0–8.5)
Triglycerides: 178 mg/dL — ABNORMAL HIGH (ref 0–149)
Uric Acid: 6.3 mg/dL (ref 3.8–8.4)
VLDL Cholesterol Cal: 31 mg/dL (ref 5–40)
WBC: 4.8 10*3/uL (ref 3.4–10.8)

## 2020-05-14 ENCOUNTER — Other Ambulatory Visit: Payer: Self-pay

## 2020-05-14 ENCOUNTER — Ambulatory Visit
Admission: RE | Admit: 2020-05-14 | Discharge: 2020-05-14 | Disposition: A | Discharge status: Home / Self Care | Payer: 59 | Source: Ambulatory Visit | Attending: Urology | Admitting: Urology

## 2020-05-14 DIAGNOSIS — N201 Calculus of ureter: Secondary | ICD-10-CM | POA: Insufficient documentation

## 2020-05-19 ENCOUNTER — Telehealth: Payer: Self-pay | Admitting: Family Medicine

## 2020-05-19 NOTE — Telephone Encounter (Signed)
-----   Message from Harle Battiest, PA-C sent at 05/19/2020 12:56 PM EDT ----- Please let Jacob Payne know that the ultrasounds of his kidneys have noted no injury and no swelling.  Is he interested in pursuing a 24 metabolic work-up at this time?

## 2020-05-19 NOTE — Telephone Encounter (Signed)
Patient notified and is not interested in the Metabolic workup at this time. He will call if anything changes.

## 2020-08-19 ENCOUNTER — Other Ambulatory Visit: Payer: Self-pay

## 2020-08-19 ENCOUNTER — Encounter: Payer: Self-pay | Admitting: Physician Assistant

## 2020-08-19 ENCOUNTER — Ambulatory Visit: Payer: 59 | Admitting: Physician Assistant

## 2020-08-19 VITALS — BP 136/86 | HR 75 | Temp 98.0°F | Resp 14 | Ht 69.0 in | Wt 220.0 lb

## 2020-08-19 DIAGNOSIS — Z0000 Encounter for general adult medical examination without abnormal findings: Secondary | ICD-10-CM

## 2020-08-19 NOTE — Progress Notes (Signed)
   Subjective: Physical evaluation    Patient ID: Jacob Payne, male    DOB: 16-Feb-1991, 29 y.o.   MRN: [id number]  HPI Patient presents for evaluation/clearance to participate in physical training program.  Patient voices no concerns or complaints.  Review of Systems     Objective:   Physical Exam HEENT is unremarkable.  Neck is supple without adenopathy or bruits.  Lungs are clear to auscultation.  Heart regular rate and rhythm.  No obvious deformity of the upper or lower extremities.  Patient has full and equal range of motion of the upper and lower extremities.  No gross deformity of cervical or lumbar spine.  Patient has full and equal range of motion cervical lumbar spine.  Cranial nerves II through XII grossly intact.       Assessment & Plan: Medical clearance  Patient is cleared for participation in sports training program.

## 2020-08-19 NOTE — Progress Notes (Signed)
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

## 2020-10-20 DIAGNOSIS — Z03818 Encounter for observation for suspected exposure to other biological agents ruled out: Secondary | ICD-10-CM | POA: Diagnosis not present

## 2020-11-13 DIAGNOSIS — R350 Frequency of micturition: Secondary | ICD-10-CM | POA: Diagnosis not present

## 2020-11-13 DIAGNOSIS — R109 Unspecified abdominal pain: Secondary | ICD-10-CM | POA: Diagnosis not present

## 2020-11-16 ENCOUNTER — Other Ambulatory Visit: Payer: Self-pay

## 2020-11-16 ENCOUNTER — Other Ambulatory Visit: Payer: Self-pay | Admitting: *Deleted

## 2020-11-16 ENCOUNTER — Ambulatory Visit
Admission: RE | Admit: 2020-11-16 | Discharge: 2020-11-16 | Disposition: A | Discharge status: Home / Self Care | Payer: 59 | Source: Ambulatory Visit | Attending: Urology | Admitting: Urology

## 2020-11-16 ENCOUNTER — Ambulatory Visit
Admission: RE | Admit: 2020-11-16 | Discharge: 2020-11-16 | Disposition: A | Discharge status: Home / Self Care | Payer: 59 | Attending: Urology | Admitting: Urology

## 2020-11-16 ENCOUNTER — Encounter: Payer: Self-pay | Admitting: Urology

## 2020-11-16 ENCOUNTER — Other Ambulatory Visit
Admission: RE | Admit: 2020-11-16 | Discharge: 2020-11-16 | Disposition: A | Discharge status: Home / Self Care | Payer: 59 | Source: Home / Self Care | Attending: Urology | Admitting: Urology

## 2020-11-16 ENCOUNTER — Ambulatory Visit (INDEPENDENT_AMBULATORY_CARE_PROVIDER_SITE_OTHER): Payer: 59 | Admitting: Urology

## 2020-11-16 VITALS — BP 142/96 | HR 91 | Wt 232.0 lb

## 2020-11-16 DIAGNOSIS — N23 Unspecified renal colic: Secondary | ICD-10-CM

## 2020-11-16 DIAGNOSIS — R300 Dysuria: Secondary | ICD-10-CM

## 2020-11-16 DIAGNOSIS — N201 Calculus of ureter: Secondary | ICD-10-CM | POA: Diagnosis not present

## 2020-11-16 DIAGNOSIS — N200 Calculus of kidney: Secondary | ICD-10-CM

## 2020-11-16 DIAGNOSIS — R109 Unspecified abdominal pain: Secondary | ICD-10-CM | POA: Diagnosis not present

## 2020-11-16 LAB — URINALYSIS, COMPLETE (UACMP) WITH MICROSCOPIC
Bilirubin Urine: NEGATIVE
Glucose, UA: NEGATIVE mg/dL
Hgb urine dipstick: NEGATIVE
Leukocytes,Ua: NEGATIVE
Nitrite: NEGATIVE
Protein, ur: NEGATIVE mg/dL
RBC / HPF: NONE SEEN RBC/hpf (ref 0–5)
Specific Gravity, Urine: 1.02 (ref 1.005–1.030)
pH: 5.5 (ref 5.0–8.0)

## 2020-11-16 MED ORDER — KETOROLAC TROMETHAMINE 10 MG PO TABS: 10.0000 mg | ORAL_TABLET | Freq: Four times a day (QID) | ORAL | 0 refills | Status: DC | PRN

## 2020-11-16 NOTE — Progress Notes (Signed)
11/16/2020 7:05 PM   [name] 15-May-1991 [id number]  Referring provider: No referring provider defined for this encounter.  Chief Complaint  Patient presents with  . Acute Visit    flank pain, trouble urinating    HPI: Mr. Eastridge is a 29 year old male with nephrolithiasis who presents today for   CT Renal stone study 04/14/2020 3 mm left distal ureteral calculus causing mild left hydronephrosis.    He failed a trial of passage and underwent left ESWL with Dr. Bernardo Heater on 04/16/2020.  He had difficulty passing fragments, so he underwent left ureteroscopy with Dr. Bernardo Heater on 04/21/2020.    His stone composition is 90% calcium oxalate dihydrate and 10% calcium oxalate monohydrate.  The stent was left attached to a tether and removed in the office on 04/24/2020.  KUB 04/30/2020 no calculi visualized.   RUS 05/14/2020 NED  Patient states that on Thanksgiving, he started to experience left-sided flank pain that radiated to the suprapubic area.  He was also having difficulty with urination.  [facility] where he was told that he likely had a kidney stone.  He states they checked his urine and he did not have infection.  They prescribed him Cipro, Flomax and Voltaren and instructed him to follow-up with urology.  Today, the pain is more in the suprapubic region and at the tip of his penis.  Patient denies any modifying or aggravating factors.  Patient denies any gross hematuria, dysuria or suprapubic/flank pain.  Patient denies any fevers, chills, nausea or vomiting.   His UA is benign.  KUB today does not identify a distinct stone, but he does have several pelvic phleboliths.  CT renal stone today notes a 2 mm right distal calculus.   PMH: Past Medical History:  Diagnosis Date  . Elevated fasting glucose   . Elevated LFTs   . Overweight     Surgical History: Past Surgical History:  Procedure Laterality Date  . APPENDECTOMY    . CYSTOSCOPY W/  RETROGRADES Left 04/21/2020   Procedure: CYSTOSCOPY WITH RETROGRADE PYELOGRAM;  Surgeon: Abbie Sons, MD;  Location: [facility];  Service: Urology;  Laterality: Left;  . CYSTOSCOPY WITH URETEROSCOPY, STONE BASKETRY AND STENT PLACEMENT Left 04/21/2020   Procedure: CYSTOSCOPY WITH URETEROSCOPY, STONE BASKETRY AND STENT PLACEMENT;  Surgeon: Abbie Sons, MD;  Location: [facility];  Service: Urology;  Laterality: Left;  . EXTRACORPOREAL SHOCK WAVE LITHOTRIPSY Left 04/16/2020   Procedure: EXTRACORPOREAL SHOCK WAVE LITHOTRIPSY (ESWL);  Surgeon: Abbie Sons, MD;  Location: [facility];  Service: Urology;  Laterality: Left;    Home Medications:  Allergies as of 11/16/2020   No Known Allergies     Medication List       Accurate as of November 16, 2020 11:59 PM. If you have any questions, ask your nurse or doctor.        ciprofloxacin 500 MG tablet Commonly known as: CIPRO Take 500 mg by mouth 2 (two) times daily.   diclofenac 75 MG EC tablet Commonly known as: VOLTAREN Take 75 mg by mouth 2 (two) times daily.   ketorolac 10 MG tablet Commonly known as: TORADOL Take 1 tablet (10 mg total) by mouth every 6 (six) hours as needed. Started by: Zara Council, PA-C   tamsulosin 0.4 MG Caps capsule Commonly known as: FLOMAX Take 0.4 mg by mouth daily.       Allergies: No Known Allergies  Family History: Family History  Problem Relation Age  of Onset  . Diabetes Father   . Cancer Maternal Grandfather     Social History:  reports that he has never smoked. He has never used smokeless tobacco. He reports that he does not drink alcohol and does not use drugs.  ROS: Pertinent ROS in HPI  Physical Exam: BP (!) 142/96   Pulse 91   Wt 232 lb (105.2 kg)   BMI 34.26 kg/m   Constitutional:  Well nourished. Alert and oriented, No acute distress. HEENT: [place] AT, mask in place.  Trachea midline Cardiovascular: No clubbing, cyanosis, or edema. Respiratory: Normal respiratory effort, no  increased work of breathing. Neurologic: Grossly intact, no focal deficits, moving all 4 extremities. Psychiatric: Normal mood and affect.  Laboratory Data: Lab Results  Component Value Date   WBC 4.8 05/12/2020   HGB 16.5 05/12/2020   HCT 49.6 05/12/2020   MCV 90 05/12/2020   PLT 179 05/12/2020    Lab Results  Component Value Date   CREATININE 1.17 05/12/2020    Lab Results  Component Value Date   AST 23 05/12/2020   Lab Results  Component Value Date   ALT 39 05/12/2020    Urinalysis Component     Latest Ref Rng & Units 11/16/2020  Color, Urine     YELLOW YELLOW  Appearance     CLEAR CLEAR  Specific Gravity, Urine     1.005 - 1.030 1.020  pH     5.0 - 8.0 5.5  Glucose, UA     NEGATIVE mg/dL NEGATIVE  Hgb urine dipstick     NEGATIVE NEGATIVE  Bilirubin Urine     NEGATIVE NEGATIVE  Ketones, ur     NEGATIVE mg/dL TRACE (A)  Protein     NEGATIVE mg/dL NEGATIVE  Nitrite     NEGATIVE NEGATIVE  Leukocytes,Ua     NEGATIVE NEGATIVE  Squamous Epithelial / LPF     0 - 5 0-5  WBC, UA     0 - 5 WBC/hpf 0-5  RBC / HPF     0 - 5 RBC/hpf NONE SEEN  Bacteria, UA     NONE SEEN FEW (A)  Mucus      PRESENT  Granular Casts, UA      PRESENT   I have reviewed the labs.   Pertinent Imaging: CLINICAL DATA:  Left flank pain.  EXAM: ABDOMEN - 1 VIEW  COMPARISON:  04/30/20  FINDINGS: The bowel gas pattern is normal. No radio-opaque calculi or other significant radiographic abnormality are seen.  IMPRESSION: Negative.   Electronically Signed   By: Kerby Moors M.D.   On: 11/16/2020 16:11CLINICAL DATA:  Left flank pain.  CLINICAL DATA:  Left flank pain and difficulty urinating.  EXAM: CT ABDOMEN AND PELVIS WITHOUT CONTRAST  TECHNIQUE: Multidetector CT imaging of the abdomen and pelvis was performed following the standard protocol without IV contrast.  COMPARISON:  04/14/2020  FINDINGS: Lower chest: The lung bases are clear of acute  process. No pleural effusion or pulmonary lesions. The heart is normal in size. No pericardial effusion. The distal esophagus and aorta are unremarkable.  Hepatobiliary: The caudate lobe is slightly prominent and the hepatic fissures are slightly dilated for the patient's age. I do not see any significant contour abnormality but correlation with liver function studies is suggested to exclude the possibility of mild or early cirrhosis. No intrahepatic biliary dilatation. The gallbladder is normal. No common bile duct dilatation.  Pancreas: No mass, inflammation or ductal dilatation.  Spleen: Spleen is  within normal limits in size measuring approximately 12 x 11 x 10 cm. No focal lesions.  Adrenals/Urinary Tract: The adrenal glands are unremarkable.  No renal calculi or hydronephrosis. No worrisome renal lesions without contrast.  There is a 1-2 mm nonobstructing calculus noted in the distal right ureter.  The bladder is unremarkable.  Stomach/Bowel: The stomach, duodenum, small bowel and colon are grossly normal without oral contrast. No inflammatory changes, mass lesions or obstructive findings. The appendix is surgically absent.  Vascular/Lymphatic: The aorta is normal in caliber. No atheroscerlotic calcifications. No mesenteric of retroperitoneal mass or adenopathy. Small scattered lymph nodes are noted.  Reproductive: The prostate gland and seminal vesicles are unremarkable.  Other: No pelvic mass or adenopathy. No free pelvic fluid collections. No inguinal mass or adenopathy. No abdominal wall hernia or subcutaneous lesions.  Musculoskeletal: No significant bony findings.  IMPRESSION: 1. 1-2 mm nonobstructing distal right ureteral calculus. 2. No other significant abdominal/pelvic findings, mass lesions or adenopathy. 3. Slightly prominent caudate lobe and hepatic fissures for the patient's age. Recommend correlation with liver function studies  to exclude the possibility of mild or early cirrhosis.    Electronically Signed   By: Marijo Sanes M.D.   On: 11/16/2020 17:02  I have independently reviewed the films.  See HPI.   Assessment & Plan:    1. Nephrolithiasis We discussed treating him clinically and having him return next week for follow-up KUB, but he would like to have a CT scan at this time and prepare for definitive stone treatment if the pain should become severe I have sent a prescription in for Toradol 10 mg every 6 hours as needed for pain to take instead of the Voltaren as it is not controlling his pain CT demonstrates a 15m distal stone RTC in one week for KUB, continue MET  2. ? Cirrhosis Notify patient to contact PCP     Return in about 1 week (around 11/23/2020) for KUB.  These notes generated with voice recognition software. I apologize for typographical errors.  SZara Council PA-C  [facility] [address] [place] [phone]

## 2020-11-17 ENCOUNTER — Telehealth: Payer: Self-pay | Admitting: Family Medicine

## 2020-11-17 ENCOUNTER — Other Ambulatory Visit: Payer: 59

## 2020-11-17 DIAGNOSIS — N200 Calculus of kidney: Secondary | ICD-10-CM

## 2020-11-17 NOTE — Telephone Encounter (Signed)
-----   Message from Harle Battiest, PA-C sent at 11/17/2020  7:59 AM EST ----- Would you call Mr. Agostino this morning and get him scheduled for an KUB and office visit next week?

## 2020-11-17 NOTE — Telephone Encounter (Signed)
Spoke to patient and scheduled him for next week also KUB order is in.

## 2020-11-20 ENCOUNTER — Other Ambulatory Visit: Payer: Self-pay | Admitting: *Deleted

## 2020-11-20 MED ORDER — TAMSULOSIN HCL 0.4 MG PO CAPS
0.4000 mg | ORAL_CAPSULE | Freq: Every day | ORAL | 1 refills | Status: DC
Start: 2020-11-20 — End: 2020-12-14

## 2020-11-24 ENCOUNTER — Other Ambulatory Visit: Payer: Self-pay | Admitting: Urology

## 2020-11-24 ENCOUNTER — Other Ambulatory Visit: Payer: Self-pay

## 2020-11-24 ENCOUNTER — Telehealth: Payer: Self-pay | Admitting: *Deleted

## 2020-11-24 NOTE — Progress Notes (Signed)
11/25/2020 10:27 AM   Jacob Payne June 01, 1991 [id number]  Referring provider: No referring provider defined for this encounter.  Chief Complaint  Patient presents with  . Nephrolithiasis    HPI: [name] is a 29 year old male with nephrolithiasis who presents today for   CT Renal stone study 04/14/2020 3 mm left distal ureteral calculus causing mild left hydronephrosis.    He failed a trial of passage and underwent left ESWL with Dr. Lonna Cobb on 04/16/2020.  He had difficulty passing fragments, so he underwent left ureteroscopy with Dr. Lonna Cobb on 04/21/2020.    His stone composition is 90% calcium oxalate dihydrate and 10% calcium oxalate monohydrate.  The stent was left attached to a tether and removed in the office on 04/24/2020.  KUB 04/30/2020 no calculi visualized.   RUS 05/14/2020 NED  Patient states that on Thanksgiving, he started to experience left-sided flank pain that radiated to the suprapubic area.  He was also having difficulty with urination.  [facility] where he was told that he likely had a kidney stone.  He states they checked his urine and he did not have infection.  They prescribed him Cipro, Flomax and Voltaren and instructed him to follow-up with urology.    At his last visit with [place], the pain is more in the suprapubic region and at the tip of his penis.  Patient denies any modifying or aggravating factors.  Patient denies any gross hematuria, dysuria or suprapubic/flank pain.  Patient denies any fevers, chills, nausea or vomiting.  His UA was benign.  KUB did not identify a distinct stone, but he does have several pelvic phleboliths.  CT renal stone noted a 2 mm right distal calculus.   Today, he continues to have discomfort in the suprapubic region and in tip of his penis.  He states the pain is less frequent.  He has not passed any stone.  Patient denies any modifying or aggravating factors.  Patient denies any gross hematuria,  dysuria or suprapubic/flank pain.  Patient denies any fevers, chills, nausea or vomiting.   KUB 11/25/2020 no stone visualized  UA negative  PMH: Past Medical History:  Diagnosis Date  . Elevated fasting glucose   . Elevated LFTs   . Overweight     Surgical History: Past Surgical History:  Procedure Laterality Date  . APPENDECTOMY    . CYSTOSCOPY W/ RETROGRADES Left 04/21/2020   Procedure: CYSTOSCOPY WITH RETROGRADE PYELOGRAM;  Surgeon: Riki Altes, MD;  Location: [facility];  Service: Urology;  Laterality: Left;  . CYSTOSCOPY WITH URETEROSCOPY, STONE BASKETRY AND STENT PLACEMENT Left 04/21/2020   Procedure: CYSTOSCOPY WITH URETEROSCOPY, STONE BASKETRY AND STENT PLACEMENT;  Surgeon: Riki Altes, MD;  Location: [facility];  Service: Urology;  Laterality: Left;  . EXTRACORPOREAL SHOCK WAVE LITHOTRIPSY Left 04/16/2020   Procedure: EXTRACORPOREAL SHOCK WAVE LITHOTRIPSY (ESWL);  Surgeon: Riki Altes, MD;  Location: [facility];  Service: Urology;  Laterality: Left;    Home Medications:  Allergies as of 11/25/2020   No Known Allergies     Medication List       Accurate as of November 25, 2020 11:59 PM. If you have any questions, ask your nurse or doctor.        ciprofloxacin 500 MG tablet Commonly known as: CIPRO Take 500 mg by mouth 2 (two) times daily.   diclofenac 75 MG EC tablet Commonly known as: VOLTAREN Take 75 mg by mouth 2 (two) times daily.  HYDROcodone-acetaminophen 5-325 MG tablet Commonly known as: NORCO/VICODIN Take 1 tablet by mouth every 6 (six) hours as needed for moderate pain. Started by: Michiel Cowboy, PA-C   ketorolac 10 MG tablet Commonly known as: TORADOL Take 1 tablet (10 mg total) by mouth every 6 (six) hours as needed.   tamsulosin 0.4 MG Caps capsule Commonly known as: FLOMAX Take 1 capsule (0.4 mg total) by mouth daily.       Allergies: No Known Allergies  Family History: Family History  Problem Relation Age of Onset  .  Diabetes Father   . Cancer Maternal Grandfather     Social History:  reports that he has never smoked. He has never used smokeless tobacco. He reports that he does not drink alcohol and does not use drugs.  ROS: Pertinent ROS in HPI  Physical Exam: BP 134/85 (BP Location: Left Arm, Patient Position: Sitting, Cuff Size: Large)   Pulse 84   Ht 5\' 10"  (1.778 m)   Wt 220 lb (99.8 kg)   BMI 31.57 kg/m   Constitutional:  Well nourished. Alert and oriented, No acute distress. HEENT: [place] AT, mask in place.  Trachea midline Cardiovascular: No clubbing, cyanosis, or edema. Respiratory: Normal respiratory effort, no increased work of breathing. Neurologic: Grossly intact, no focal deficits, moving all 4 extremities. Psychiatric: Normal mood and affect.  Laboratory Data: Lab Results  Component Value Date   WBC 4.8 05/12/2020   HGB 16.5 05/12/2020   HCT 49.6 05/12/2020   MCV 90 05/12/2020   PLT 179 05/12/2020    Lab Results  Component Value Date   CREATININE 1.17 05/12/2020    Lab Results  Component Value Date   AST 23 05/12/2020   Lab Results  Component Value Date   ALT 39 05/12/2020    Urinalysis Component     Latest Ref Rng & Units 11/25/2020  Specific Gravity, UA     1.005 - 1.030 1.020  pH, UA     5.0 - 7.5 6.0  Color, UA     Yellow Yellow  Appearance Ur     Clear Clear  Leukocytes,UA     Negative Negative  Protein,UA     Negative/Trace Negative  Glucose, UA     Negative Negative  Ketones, UA     Negative Negative  RBC, UA     Negative Trace (A)  Bilirubin, UA     Negative Negative  Urobilinogen, Ur     0.2 - 1.0 mg/dL 0.2  Nitrite, UA     Negative Negative  Microscopic Examination      See below:   Component     Latest Ref Rng & Units 11/25/2020  Epithelial Cells (non renal)     0 - 10 /hpf None seen   I have reviewed the labs.   Pertinent Imaging: CLINICAL DATA:  Left flank pain.  EXAM: ABDOMEN - 1 VIEW  COMPARISON:   04/30/20  FINDINGS: The bowel gas pattern is normal. No radio-opaque calculi or other significant radiographic abnormality are seen.  IMPRESSION: Negative.   Electronically Signed   By: 05/02/20 M.D.   On: 11/16/2020 16:11CLINICAL DATA:  Left flank pain.  CLINICAL DATA:  Left flank pain and difficulty urinating.  EXAM: CT ABDOMEN AND PELVIS WITHOUT CONTRAST  TECHNIQUE: Multidetector CT imaging of the abdomen and pelvis was performed following the standard protocol without IV contrast.  COMPARISON:  04/14/2020  FINDINGS: Lower chest: The lung bases are clear of acute process. No pleural effusion or  pulmonary lesions. The heart is normal in size. No pericardial effusion. The distal esophagus and aorta are unremarkable.  Hepatobiliary: The caudate lobe is slightly prominent and the hepatic fissures are slightly dilated for the patient's age. I do not see any significant contour abnormality but correlation with liver function studies is suggested to exclude the possibility of mild or early cirrhosis. No intrahepatic biliary dilatation. The gallbladder is normal. No common bile duct dilatation.  Pancreas: No mass, inflammation or ductal dilatation.  Spleen: Spleen is within normal limits in size measuring approximately 12 x 11 x 10 cm. No focal lesions.  Adrenals/Urinary Tract: The adrenal glands are unremarkable.  No renal calculi or hydronephrosis. No worrisome renal lesions without contrast.  There is a 1-2 mm nonobstructing calculus noted in the distal right ureter.  The bladder is unremarkable.  Stomach/Bowel: The stomach, duodenum, small bowel and colon are grossly normal without oral contrast. No inflammatory changes, mass lesions or obstructive findings. The appendix is surgically absent.  Vascular/Lymphatic: The aorta is normal in caliber. No atheroscerlotic calcifications. No mesenteric of retroperitoneal mass or adenopathy. Small  scattered lymph nodes are noted.  Reproductive: The prostate gland and seminal vesicles are unremarkable.  Other: No pelvic mass or adenopathy. No free pelvic fluid collections. No inguinal mass or adenopathy. No abdominal wall hernia or subcutaneous lesions.  Musculoskeletal: No significant bony findings.  IMPRESSION: 1. 1-2 mm nonobstructing distal right ureteral calculus. 2. No other significant abdominal/pelvic findings, mass lesions or adenopathy. 3. Slightly prominent caudate lobe and hepatic fissures for the patient's age. Recommend correlation with liver function studies to exclude the possibility of mild or early cirrhosis.    Electronically Signed   By: Rudie Meyer M.D.   On: 11/16/2020 17:02  Narrative & Impression  CLINICAL DATA:  History of kidney stone, left flank pain  EXAM: ABDOMEN - 1 VIEW  COMPARISON:  CT 11/16/2020  FINDINGS: The bowel gas pattern is normal. No radio-opaque calculi or other significant radiographic abnormality are seen. Phleboliths in the pelvis.  IMPRESSION: Negative.   Electronically Signed   By: Jasmine Pang M.D.   On: 11/25/2020 22:52    I have independently reviewed the films.  See HPI.   Assessment & Plan:    1. Nephrolithiasis Discussed with patient regarding next step and offered ureteroscopy, but patient deferred at this time opting for another week trial of medical expulsion therapy Increase Flomax to 2 tablets daily Vicodin 5/325 mg, # 10 given for pain Return next week for office visit  2. ? Cirrhosis Notify patient to contact PCP   Return in about 1 week (around 12/02/2020) for symptoms recheck .  These notes generated with voice recognition software. I apologize for typographical errors.  Michiel Cowboy, PA-C  [facility] [address]  [address] [place], [place] [address] [phone]

## 2020-11-24 NOTE — Telephone Encounter (Signed)
Patient called in today and states he is almost out of pain medication . I talked  with Carollee Herter and she is seeing him tomorrow following his KUB . Will discuss medication at office visit.

## 2020-11-25 ENCOUNTER — Ambulatory Visit
Admission: RE | Admit: 2020-11-25 | Discharge: 2020-11-25 | Disposition: A | Discharge status: Home / Self Care | Payer: 59 | Attending: Urology | Admitting: Urology

## 2020-11-25 ENCOUNTER — Ambulatory Visit (INDEPENDENT_AMBULATORY_CARE_PROVIDER_SITE_OTHER): Payer: 59 | Admitting: Urology

## 2020-11-25 ENCOUNTER — Encounter: Payer: Self-pay | Admitting: Urology

## 2020-11-25 ENCOUNTER — Other Ambulatory Visit: Payer: Self-pay

## 2020-11-25 ENCOUNTER — Ambulatory Visit
Admission: RE | Admit: 2020-11-25 | Discharge: 2020-11-25 | Disposition: A | Discharge status: Home / Self Care | Payer: 59 | Source: Ambulatory Visit | Attending: Urology | Admitting: Urology

## 2020-11-25 VITALS — BP 134/85 | HR 84 | Ht 70.0 in | Wt 220.0 lb

## 2020-11-25 DIAGNOSIS — R932 Abnormal findings on diagnostic imaging of liver and biliary tract: Secondary | ICD-10-CM

## 2020-11-25 DIAGNOSIS — R109 Unspecified abdominal pain: Secondary | ICD-10-CM | POA: Diagnosis not present

## 2020-11-25 DIAGNOSIS — N200 Calculus of kidney: Secondary | ICD-10-CM

## 2020-11-25 MED ORDER — HYDROCODONE-ACETAMINOPHEN 5-325 MG PO TABS: 1.0000 | ORAL_TABLET | Freq: Four times a day (QID) | ORAL | 0 refills | Status: DC | PRN

## 2020-11-27 LAB — URINALYSIS, COMPLETE
Bilirubin, UA: NEGATIVE
Glucose, UA: NEGATIVE
Ketones, UA: NEGATIVE
Leukocytes,UA: NEGATIVE
Nitrite, UA: NEGATIVE
Protein,UA: NEGATIVE
Specific Gravity, UA: 1.02 (ref 1.005–1.030)
Urobilinogen, Ur: 0.2 mg/dL (ref 0.2–1.0)
pH, UA: 6 (ref 5.0–7.5)

## 2020-11-27 LAB — MICROSCOPIC EXAMINATION
Bacteria, UA: NONE SEEN
Epithelial Cells (non renal): NONE SEEN /hpf (ref 0–10)
RBC, Urine: NONE SEEN /hpf (ref 0–2)

## 2020-11-30 ENCOUNTER — Ambulatory Visit: Payer: 59 | Admitting: Urology

## 2020-12-03 ENCOUNTER — Other Ambulatory Visit: Payer: Self-pay

## 2020-12-03 ENCOUNTER — Ambulatory Visit
Admission: RE | Admit: 2020-12-03 | Discharge: 2020-12-03 | Disposition: A | Discharge status: Home / Self Care | Payer: 59 | Source: Ambulatory Visit | Attending: Urology | Admitting: Urology

## 2020-12-03 ENCOUNTER — Ambulatory Visit (INDEPENDENT_AMBULATORY_CARE_PROVIDER_SITE_OTHER): Payer: 59 | Admitting: Urology

## 2020-12-03 ENCOUNTER — Other Ambulatory Visit: Payer: Self-pay | Admitting: Urology

## 2020-12-03 ENCOUNTER — Other Ambulatory Visit
Admission: RE | Admit: 2020-12-03 | Discharge: 2020-12-03 | Disposition: A | Discharge status: Home / Self Care | Payer: 59 | Source: Ambulatory Visit | Attending: Urology | Admitting: Urology

## 2020-12-03 VITALS — BP 137/84 | HR 97 | Ht 70.0 in | Wt 220.0 lb

## 2020-12-03 DIAGNOSIS — N23 Unspecified renal colic: Secondary | ICD-10-CM | POA: Insufficient documentation

## 2020-12-03 DIAGNOSIS — Z20822 Contact with and (suspected) exposure to covid-19: Secondary | ICD-10-CM | POA: Insufficient documentation

## 2020-12-03 DIAGNOSIS — N201 Calculus of ureter: Secondary | ICD-10-CM

## 2020-12-03 DIAGNOSIS — R109 Unspecified abdominal pain: Secondary | ICD-10-CM | POA: Diagnosis not present

## 2020-12-03 DIAGNOSIS — R300 Dysuria: Secondary | ICD-10-CM | POA: Diagnosis not present

## 2020-12-03 DIAGNOSIS — Z87442 Personal history of urinary calculi: Secondary | ICD-10-CM | POA: Diagnosis not present

## 2020-12-03 DIAGNOSIS — R932 Abnormal findings on diagnostic imaging of liver and biliary tract: Secondary | ICD-10-CM

## 2020-12-03 DIAGNOSIS — R3915 Urgency of urination: Secondary | ICD-10-CM | POA: Diagnosis not present

## 2020-12-03 DIAGNOSIS — Z01812 Encounter for preprocedural laboratory examination: Secondary | ICD-10-CM | POA: Insufficient documentation

## 2020-12-03 LAB — MICROSCOPIC EXAMINATION
Bacteria, UA: NONE SEEN
Epithelial Cells (non renal): NONE SEEN /hpf (ref 0–10)
RBC, Urine: NONE SEEN /hpf (ref 0–2)

## 2020-12-03 LAB — URINALYSIS, COMPLETE
Bilirubin, UA: NEGATIVE
Glucose, UA: NEGATIVE
Ketones, UA: NEGATIVE
Leukocytes,UA: NEGATIVE
Nitrite, UA: NEGATIVE
Protein,UA: NEGATIVE
RBC, UA: NEGATIVE
Specific Gravity, UA: 1.02 (ref 1.005–1.030)
Urobilinogen, Ur: 0.2 mg/dL (ref 0.2–1.0)
pH, UA: 7 (ref 5.0–7.5)

## 2020-12-03 LAB — SARS CORONAVIRUS 2 (TAT 6-24 HRS): SARS Coronavirus 2: NEGATIVE

## 2020-12-03 NOTE — Progress Notes (Signed)
12/03/2020 9:13 PM   Jacob Payne 09-08-1991 [id number]  Referring provider: No referring provider defined for this encounter.  Chief Complaint  Patient presents with  . Nephrolithiasis    HPI: [name] is a 29 year old male with nephrolithiasis who presents today for flank pain  CT Renal stone study 04/14/2020 3 mm left distal ureteral calculus causing mild left hydronephrosis.    He failed a trial of passage and underwent left ESWL with Dr. Lonna CobbStoioff on 04/16/2020.  He had difficulty passing fragments, so he underwent left ureteroscopy with Dr. Lonna CobbStoioff on 04/21/2020.    His stone composition is 90% calcium oxalate dihydrate and 10% calcium oxalate monohydrate.  The stent was left attached to a tether and removed in the office on 04/24/2020.  KUB 04/30/2020 no calculi visualized.   RUS 05/14/2020 NED  Patient states that on Thanksgiving, he started to experience left-sided flank pain that radiated to the suprapubic area.  He was also having difficulty with urination.  [facility] where he was told that he likely had a kidney stone.  He states they checked his urine and he did not have infection.  They prescribed him Cipro, Flomax and Voltaren and instructed him to follow-up with urology.    On 11/16/2020, the pain is more in the suprapubic region and at the tip of his penis.  Patient denies any modifying or aggravating factors.  Patient denies any gross hematuria, dysuria or suprapubic/flank pain.  Patient denies any fevers, chills, nausea or vomiting.  His UA was benign.  KUB did not identify a distinct stone, but he does have several pelvic phleboliths.  CT renal stone noted a 2 mm right distal calculus.   On 11/25/2020, he continued to have discomfort in the suprapubic region and in tip of his penis. He states the pain is less frequent.  He has not passed any stone.  Patient denies any modifying or aggravating factors.  Patient denies any gross  hematuria, dysuria or suprapubic/flank pain.  Patient denies any fevers, chills, nausea or vomiting.   KUB 11/25/2020 no stone visualized  He was engaging in some hiking over some rugged terrain and he experienced intense pain in the left flank area.  Patient denies any modifying or aggravating factors.  Patient denies any gross hematuria, dysuria or suprapubic/flank pain.  Patient denies any fevers, chills, nausea or vomiting.    UA negative  CT Renal stone study notes the previous right ureteral stone has passed and there is no finding to explain the pain on the left.    PMH: Past Medical History:  Diagnosis Date  . Elevated fasting glucose   . Elevated LFTs   . Overweight     Surgical History: Past Surgical History:  Procedure Laterality Date  . APPENDECTOMY    . CYSTOSCOPY W/ RETROGRADES Left 04/21/2020   Procedure: CYSTOSCOPY WITH RETROGRADE PYELOGRAM;  Surgeon: Riki AltesStoioff, Scott C, MD;  Location: [facility];  Service: Urology;  Laterality: Left;  . CYSTOSCOPY WITH URETEROSCOPY, STONE BASKETRY AND STENT PLACEMENT Left 04/21/2020   Procedure: CYSTOSCOPY WITH URETEROSCOPY, STONE BASKETRY AND STENT PLACEMENT;  Surgeon: Riki AltesStoioff, Scott C, MD;  Location: [facility];  Service: Urology;  Laterality: Left;  . EXTRACORPOREAL SHOCK WAVE LITHOTRIPSY Left 04/16/2020   Procedure: EXTRACORPOREAL SHOCK WAVE LITHOTRIPSY (ESWL);  Surgeon: Riki AltesStoioff, Scott C, MD;  Location: [facility];  Service: Urology;  Laterality: Left;    Home Medications:  Allergies as of 12/03/2020   No Known Allergies  Medication List       Accurate as of December 03, 2020 11:59 PM. If you have any questions, ask your nurse or doctor.        STOP taking these medications   ciprofloxacin 500 MG tablet Commonly known as: CIPRO Stopped by: Rohen Kimes, PA-C   ketorolac 10 MG tablet Commonly known as: TORADOL Stopped by: Jodey Burbano, PA-C     TAKE these medications   acetaminophen 500 MG tablet Commonly known as:  TYLENOL Take 1,000 mg by mouth every 6 (six) hours as needed for moderate pain.   diclofenac 75 MG EC tablet Commonly known as: VOLTAREN Take 75 mg by mouth daily as needed for mild pain.   HYDROcodone-acetaminophen 5-325 MG tablet Commonly known as: NORCO/VICODIN Take 1 tablet by mouth at bedtime. What changed: Another medication with the same name was removed. Continue taking this medication, and follow the directions you see here. Changed by: Michiel Cowboy, PA-C   tamsulosin 0.4 MG Caps capsule Commonly known as: FLOMAX Take 1 capsule (0.4 mg total) by mouth daily.       Allergies: No Known Allergies  Family History: Family History  Problem Relation Age of Onset  . Diabetes Father   . Cancer Maternal Grandfather     Social History:  reports that he has never smoked. He has never used smokeless tobacco. He reports that he does not drink alcohol and does not use drugs.  ROS: Pertinent ROS in HPI  Physical Exam: BP 137/84   Pulse 97   Ht 5\' 10"  (1.778 m)   Wt 220 lb (99.8 kg)   BMI 31.57 kg/m   Constitutional:  Well nourished. Alert and oriented, No acute distress. HEENT: [place] AT, mask in place.  Trachea midline Cardiovascular: No clubbing, cyanosis, or edema. Respiratory: Normal respiratory effort, no increased work of breathing. Neurologic: Grossly intact, no focal deficits, moving all 4 extremities. Psychiatric: Normal mood and affect.  Laboratory Data: Lab Results  Component Value Date   WBC 4.8 05/12/2020   HGB 16.5 05/12/2020   HCT 49.6 05/12/2020   MCV 90 05/12/2020   PLT 179 05/12/2020    Lab Results  Component Value Date   CREATININE 1.17 05/12/2020    Lab Results  Component Value Date   AST 23 05/12/2020   Lab Results  Component Value Date   ALT 39 05/12/2020    Urinalysis Component     Latest Ref Rng & Units 12/03/2020  Specific Gravity, UA     1.005 - 1.030 1.020  pH, UA     5.0 - 7.5 7.0  Color, UA     Yellow Yellow   Appearance Ur     Clear Clear  Leukocytes,UA     Negative Negative  Protein,UA     Negative/Trace Negative  Glucose, UA     Negative Negative  Ketones, UA     Negative Negative  RBC, UA     Negative Negative  Bilirubin, UA     Negative Negative  Urobilinogen, Ur     0.2 - 1.0 mg/dL 0.2  Nitrite, UA     Negative Negative  Microscopic Examination      See below:   Component     Latest Ref Rng & Units 12/03/2020  WBC, UA     0 - 5 /hpf 0-5  RBC     0 - 2 /hpf None seen  Epithelial Cells (non renal)     0 - 10 /hpf None seen  Bacteria, UA     None seen/Few None seen     I have reviewed the labs.   Pertinent Imaging: CLINICAL DATA:  Left flank pain.  EXAM: ABDOMEN - 1 VIEW  COMPARISON:  04/30/20  FINDINGS: The bowel gas pattern is normal. No radio-opaque calculi or other significant radiographic abnormality are seen.  IMPRESSION: Negative.   Electronically Signed   By: Signa Kell M.D.   On: 11/16/2020 16:11CLINICAL DATA:  Left flank pain.  CLINICAL DATA:  Left flank pain and difficulty urinating.  EXAM: CT ABDOMEN AND PELVIS WITHOUT CONTRAST  TECHNIQUE: Multidetector CT imaging of the abdomen and pelvis was performed following the standard protocol without IV contrast.  COMPARISON:  04/14/2020  FINDINGS: Lower chest: The lung bases are clear of acute process. No pleural effusion or pulmonary lesions. The heart is normal in size. No pericardial effusion. The distal esophagus and aorta are unremarkable.  Hepatobiliary: The caudate lobe is slightly prominent and the hepatic fissures are slightly dilated for the patient's age. I do not see any significant contour abnormality but correlation with liver function studies is suggested to exclude the possibility of mild or early cirrhosis. No intrahepatic biliary dilatation. The gallbladder is normal. No common bile duct dilatation.  Pancreas: No mass, inflammation or ductal  dilatation.  Spleen: Spleen is within normal limits in size measuring approximately 12 x 11 x 10 cm. No focal lesions.  Adrenals/Urinary Tract: The adrenal glands are unremarkable.  No renal calculi or hydronephrosis. No worrisome renal lesions without contrast.  There is a 1-2 mm nonobstructing calculus noted in the distal right ureter.  The bladder is unremarkable.  Stomach/Bowel: The stomach, duodenum, small bowel and colon are grossly normal without oral contrast. No inflammatory changes, mass lesions or obstructive findings. The appendix is surgically absent.  Vascular/Lymphatic: The aorta is normal in caliber. No atheroscerlotic calcifications. No mesenteric of retroperitoneal mass or adenopathy. Small scattered lymph nodes are noted.  Reproductive: The prostate gland and seminal vesicles are unremarkable.  Other: No pelvic mass or adenopathy. No free pelvic fluid collections. No inguinal mass or adenopathy. No abdominal wall hernia or subcutaneous lesions.  Musculoskeletal: No significant bony findings.  IMPRESSION: 1. 1-2 mm nonobstructing distal right ureteral calculus. 2. No other significant abdominal/pelvic findings, mass lesions or adenopathy. 3. Slightly prominent caudate lobe and hepatic fissures for the patient's age. Recommend correlation with liver function studies to exclude the possibility of mild or early cirrhosis.    Electronically Signed   By: Rudie Meyer M.D.   On: 11/16/2020 17:02  Narrative & Impression  CLINICAL DATA:  History of kidney stone, left flank pain  EXAM: ABDOMEN - 1 VIEW  COMPARISON:  CT 11/16/2020  FINDINGS: The bowel gas pattern is normal. No radio-opaque calculi or other significant radiographic abnormality are seen. Phleboliths in the pelvis.  IMPRESSION: Negative.   Electronically Signed   By: Jasmine Pang M.D.   On: 11/25/2020 22:52    CLINICAL DATA:  Left flank pain for 3 weeks.  Urinary urgency and dysuria. Urolithiasis.  EXAM: CT ABDOMEN AND PELVIS WITHOUT CONTRAST  TECHNIQUE: Multidetector CT imaging of the abdomen and pelvis was performed following the standard protocol without IV contrast.  COMPARISON:  11/16/2020  FINDINGS: Lower chest: No acute findings.  Hepatobiliary: No mass visualized on this unenhanced exam. Gallbladder is unremarkable. No evidence of biliary ductal dilatation.  Pancreas: No mass or inflammatory process visualized on this unenhanced exam.  Spleen:  Within normal limits in size.  Adrenals/Urinary  tract: Several tiny phleboliths are again noted in the right pelvis, but no definite evidence of ureteral calculi. No evidence of renal calculi or hydronephrosis. Unremarkable unopacified urinary bladder.  Stomach/Bowel: No evidence of obstruction, inflammatory process, or abnormal fluid collections.  Vascular/Lymphatic: No pathologically enlarged lymph nodes identified. No evidence of abdominal aortic aneurysm.  Reproductive:  No mass or other significant abnormality.  Other:  None.  Musculoskeletal:  No suspicious bone lesions identified.  IMPRESSION: No evidence of urolithiasis, hydronephrosis, or other acute findings.   Electronically Signed   By: Danae Orleans M.D.   On: 12/03/2020 14:53 I have independently reviewed the films.  See HPI.   Assessment & Plan:    1. Right ureteral stone The 2 mm right distal ureteral stone is no longer visible on today's CT  2. Left flank pain He will continue to take NSAIDS for pain and tamsulosin to facilitate passage of any punctate fragments I will contact patient on Monday   3. Cirrhosis Notify patient to contact PCP  Given printed copy of CT for PCP  Return for contact patient on Monday .  These notes generated with voice recognition software. I apologize for typographical errors.  Michiel Cowboy, PA-C  [facility] [address]  [address] [place], [place] [address] [phone]

## 2020-12-04 ENCOUNTER — Encounter: Admission: RE | Payer: Self-pay | Source: Home / Self Care

## 2020-12-04 ENCOUNTER — Ambulatory Visit: Admission: RE | Admit: 2020-12-04 | Payer: 59 | Source: Home / Self Care | Admitting: Urology

## 2020-12-04 SURGERY — CYSTOSCOPY/URETEROSCOPY/HOLMIUM LASER/STENT PLACEMENT
Anesthesia: Choice | Laterality: Right

## 2020-12-11 LAB — CULTURE, URINE COMPREHENSIVE

## 2020-12-12 ENCOUNTER — Other Ambulatory Visit: Payer: Self-pay | Admitting: Urology

## 2020-12-12 ENCOUNTER — Encounter: Payer: Self-pay | Admitting: Urology

## 2020-12-28 NOTE — Progress Notes (Deleted)
12/29/2020 1:06 PM   Jacob Eatonlexander A Payne 05-04-1991 [id number]  Referring provider: No referring provider defined for this encounter.  No chief complaint on file.   HPI: Jacob Payne is a 30 year old male with nephrolithiasis who presents today for flank pain  CT Renal stone study 04/14/2020 3 mm left distal ureteral calculus causing mild left hydronephrosis.    He failed a trial of passage and underwent left ESWL with Dr. Lonna CobbStoioff on 04/16/2020.  He had difficulty passing fragments, so he underwent left ureteroscopy with Dr. Lonna CobbStoioff on 04/21/2020.    His stone composition is 90% calcium oxalate dihydrate and 10% calcium oxalate monohydrate.  The stent was left attached to a tether and removed in the office on 04/24/2020.  KUB 04/30/2020 no calculi visualized.   RUS 05/14/2020 NED  Patient states that on Thanksgiving, he started to experience left-sided flank pain that radiated to the suprapubic area.  He was also having difficulty with urination.  [facility] where he was told that he likely had a kidney stone.  He states they checked his urine and he did not have infection.  They prescribed him Cipro, Flomax and Voltaren and instructed him to follow-up with urology.    On 11/16/2020, the pain is more in the suprapubic region and at the tip of his penis.  Patient denies any modifying or aggravating factors.  Patient denies any gross hematuria, dysuria or suprapubic/flank pain.  Patient denies any fevers, chills, nausea or vomiting.  His UA was benign.  KUB did not identify a distinct stone, but he does have several pelvic phleboliths.  CT renal stone noted a 2 mm right distal calculus.   On 11/25/2020, he continued to have discomfort in the suprapubic region and in tip of his penis. He states the pain is less frequent.  He has not passed any stone.  Patient denies any modifying or aggravating factors.  Patient denies any gross hematuria, dysuria or suprapubic/flank  pain.  Patient denies any fevers, chills, nausea or vomiting.   KUB 11/25/2020 no stone visualized  He was engaging in some hiking over some rugged terrain and he experienced intense pain in the left flank area.  Patient denies any modifying or aggravating factors.  Patient denies any gross hematuria, dysuria or suprapubic/flank pain.  Patient denies any fevers, chills, nausea or vomiting.    UA negative  CT Renal stone study notes the previous right ureteral stone has passed and there is no finding to explain the pain on the left.    PMH: Past Medical History:  Diagnosis Date  . Elevated fasting glucose   . Elevated LFTs   . Overweight     Surgical History: Past Surgical History:  Procedure Laterality Date  . APPENDECTOMY    . CYSTOSCOPY W/ RETROGRADES Left 04/21/2020   Procedure: CYSTOSCOPY WITH RETROGRADE PYELOGRAM;  Surgeon: Riki AltesStoioff, Scott C, MD;  Location: [facility];  Service: Urology;  Laterality: Left;  . CYSTOSCOPY WITH URETEROSCOPY, STONE BASKETRY AND STENT PLACEMENT Left 04/21/2020   Procedure: CYSTOSCOPY WITH URETEROSCOPY, STONE BASKETRY AND STENT PLACEMENT;  Surgeon: Riki AltesStoioff, Scott C, MD;  Location: [facility];  Service: Urology;  Laterality: Left;  . EXTRACORPOREAL SHOCK WAVE LITHOTRIPSY Left 04/16/2020   Procedure: EXTRACORPOREAL SHOCK WAVE LITHOTRIPSY (ESWL);  Surgeon: Riki AltesStoioff, Scott C, MD;  Location: [facility];  Service: Urology;  Laterality: Left;    Home Medications:  Allergies as of 12/29/2020   No Known Allergies     Medication  List       Accurate as of December 28, 2020  1:06 PM. If you have any questions, ask your nurse or doctor.        acetaminophen 500 MG tablet Commonly known as: TYLENOL Take 1,000 mg by mouth every 6 (six) hours as needed for moderate pain.   diclofenac 75 MG EC tablet Commonly known as: VOLTAREN Take 75 mg by mouth daily as needed for mild pain.   HYDROcodone-acetaminophen 5-325 MG tablet Commonly known as: NORCO/VICODIN Take 1  tablet by mouth at bedtime.   tamsulosin 0.4 MG Caps capsule Commonly known as: FLOMAX TAKE 1 CAPSULE BY MOUTH EVERY DAY       Allergies: No Known Allergies  Family History: Family History  Problem Relation Age of Onset  . Diabetes Father   . Cancer Maternal Grandfather     Social History:  reports that he has never smoked. He has never used smokeless tobacco. He reports that he does not drink alcohol and does not use drugs.  ROS: Pertinent ROS in HPI  Physical Exam: There were no vitals taken for this visit.  Constitutional:  Well nourished. Alert and oriented, No acute distress. HEENT: [address] AT, mask in place.  Trachea midline Cardiovascular: No clubbing, cyanosis, or edema. Respiratory: Normal respiratory effort, no increased work of breathing. Neurologic: Grossly intact, no focal deficits, moving all 4 extremities. Psychiatric: Normal mood and affect.  Laboratory Data: Lab Results  Component Value Date   WBC 4.8 05/12/2020   HGB 16.5 05/12/2020   HCT 49.6 05/12/2020   MCV 90 05/12/2020   PLT 179 05/12/2020    Lab Results  Component Value Date   CREATININE 1.17 05/12/2020    Lab Results  Component Value Date   AST 23 05/12/2020   Lab Results  Component Value Date   ALT 39 05/12/2020    Urinalysis Component     Latest Ref Rng & Units 12/03/2020  Specific Gravity, UA     1.005 - 1.030 1.020  pH, UA     5.0 - 7.5 7.0  Color, UA     Yellow Yellow  Appearance Ur     Clear Clear  Leukocytes,UA     Negative Negative  Protein,UA     Negative/Trace Negative  Glucose, UA     Negative Negative  Ketones, UA     Negative Negative  RBC, UA     Negative Negative  Bilirubin, UA     Negative Negative  Urobilinogen, Ur     0.2 - 1.0 mg/dL 0.2  Nitrite, UA     Negative Negative  Microscopic Examination      See below:   Component     Latest Ref Rng & Units 12/03/2020  WBC, UA     0 - 5 /hpf 0-5  RBC     0 - 2 /hpf None seen  Epithelial Cells  (non renal)     0 - 10 /hpf None seen  Bacteria, UA     None seen/Few None seen     I have reviewed the labs.   Pertinent Imaging: CLINICAL DATA:  Left flank pain.  EXAM: ABDOMEN - 1 VIEW  COMPARISON:  04/30/20  FINDINGS: The bowel gas pattern is normal. No radio-opaque calculi or other significant radiographic abnormality are seen.  IMPRESSION: Negative.   Electronically Signed   By: Signa Kell M.D.   On: 11/16/2020 16:11CLINICAL DATA:  Left flank pain.  CLINICAL DATA:  Left flank pain and  difficulty urinating.  EXAM: CT ABDOMEN AND PELVIS WITHOUT CONTRAST  TECHNIQUE: Multidetector CT imaging of the abdomen and pelvis was performed following the standard protocol without IV contrast.  COMPARISON:  04/14/2020  FINDINGS: Lower chest: The lung bases are clear of acute process. No pleural effusion or pulmonary lesions. The heart is normal in size. No pericardial effusion. The distal esophagus and aorta are unremarkable.  Hepatobiliary: The caudate lobe is slightly prominent and the hepatic fissures are slightly dilated for the patient's age. I do not see any significant contour abnormality but correlation with liver function studies is suggested to exclude the possibility of mild or early cirrhosis. No intrahepatic biliary dilatation. The gallbladder is normal. No common bile duct dilatation.  Pancreas: No mass, inflammation or ductal dilatation.  Spleen: Spleen is within normal limits in size measuring approximately 12 x 11 x 10 cm. No focal lesions.  Adrenals/Urinary Tract: The adrenal glands are unremarkable.  No renal calculi or hydronephrosis. No worrisome renal lesions without contrast.  There is a 1-2 mm nonobstructing calculus noted in the distal right ureter.  The bladder is unremarkable.  Stomach/Bowel: The stomach, duodenum, small bowel and colon are grossly normal without oral contrast. No inflammatory changes,  mass lesions or obstructive findings. The appendix is surgically absent.  Vascular/Lymphatic: The aorta is normal in caliber. No atheroscerlotic calcifications. No mesenteric of retroperitoneal mass or adenopathy. Small scattered lymph nodes are noted.  Reproductive: The prostate gland and seminal vesicles are unremarkable.  Other: No pelvic mass or adenopathy. No free pelvic fluid collections. No inguinal mass or adenopathy. No abdominal wall hernia or subcutaneous lesions.  Musculoskeletal: No significant bony findings.  IMPRESSION: 1. 1-2 mm nonobstructing distal right ureteral calculus. 2. No other significant abdominal/pelvic findings, mass lesions or adenopathy. 3. Slightly prominent caudate lobe and hepatic fissures for the patient's age. Recommend correlation with liver function studies to exclude the possibility of mild or early cirrhosis.    Electronically Signed   By: Rudie Meyer M.D.   On: 11/16/2020 17:02  Narrative & Impression  CLINICAL DATA:  History of kidney stone, left flank pain  EXAM: ABDOMEN - 1 VIEW  COMPARISON:  CT 11/16/2020  FINDINGS: The bowel gas pattern is normal. No radio-opaque calculi or other significant radiographic abnormality are seen. Phleboliths in the pelvis.  IMPRESSION: Negative.   Electronically Signed   By: Jasmine Pang M.D.   On: 11/25/2020 22:52    CLINICAL DATA:  Left flank pain for 3 weeks. Urinary urgency and dysuria. Urolithiasis.  EXAM: CT ABDOMEN AND PELVIS WITHOUT CONTRAST  TECHNIQUE: Multidetector CT imaging of the abdomen and pelvis was performed following the standard protocol without IV contrast.  COMPARISON:  11/16/2020  FINDINGS: Lower chest: No acute findings.  Hepatobiliary: No mass visualized on this unenhanced exam. Gallbladder is unremarkable. No evidence of biliary ductal dilatation.  Pancreas: No mass or inflammatory process visualized on this unenhanced  exam.  Spleen:  Within normal limits in size.  Adrenals/Urinary tract: Several tiny phleboliths are again noted in the right pelvis, but no definite evidence of ureteral calculi. No evidence of renal calculi or hydronephrosis. Unremarkable unopacified urinary bladder.  Stomach/Bowel: No evidence of obstruction, inflammatory process, or abnormal fluid collections.  Vascular/Lymphatic: No pathologically enlarged lymph nodes identified. No evidence of abdominal aortic aneurysm.  Reproductive:  No mass or other significant abnormality.  Other:  None.  Musculoskeletal:  No suspicious bone lesions identified.  IMPRESSION: No evidence of urolithiasis, hydronephrosis, or other acute findings.  Electronically Signed   By: Danae Orleans M.D.   On: 12/03/2020 14:53 I have independently reviewed the films.  See HPI.   Assessment & Plan:    1. Right ureteral stone The 2 mm right distal ureteral stone is no longer visible on today's CT  2. Left flank pain He will continue to take NSAIDS for pain and tamsulosin to facilitate passage of any punctate fragments I will contact patient on Monday   3. Cirrhosis Notify patient to contact PCP  Given printed copy of CT for PCP  No follow-ups on file.  These notes generated with voice recognition software. I apologize for typographical errors.  Michiel Cowboy, PA-C  [facility] [address]  [address] [place], [place] [address] [phone]

## 2020-12-29 ENCOUNTER — Ambulatory Visit: Payer: 59 | Admitting: Urology

## 2020-12-29 DIAGNOSIS — R109 Unspecified abdominal pain: Secondary | ICD-10-CM

## 2020-12-31 ENCOUNTER — Ambulatory Visit: Payer: 59 | Admitting: Urology

## 2020-12-31 DIAGNOSIS — Z87442 Personal history of urinary calculi: Secondary | ICD-10-CM | POA: Diagnosis not present

## 2020-12-31 DIAGNOSIS — M545 Low back pain, unspecified: Secondary | ICD-10-CM | POA: Diagnosis not present

## 2021-01-06 DIAGNOSIS — M545 Low back pain, unspecified: Secondary | ICD-10-CM | POA: Diagnosis not present

## 2021-01-06 DIAGNOSIS — M6289 Other specified disorders of muscle: Secondary | ICD-10-CM | POA: Diagnosis not present

## 2021-01-06 DIAGNOSIS — M62838 Other muscle spasm: Secondary | ICD-10-CM | POA: Diagnosis not present

## 2021-01-06 DIAGNOSIS — M6281 Muscle weakness (generalized): Secondary | ICD-10-CM | POA: Diagnosis not present

## 2021-01-09 ENCOUNTER — Other Ambulatory Visit: Payer: Self-pay | Admitting: Urology

## 2021-01-20 IMAGING — CR DG ABDOMEN 1V
1 series · 2 of 2 positions shown · non-contrast
Comparison: CT 11/16/2020

CLINICAL DATA: History of kidney stone, left flank pain

EXAM:
ABDOMEN - 1 VIEW

[Series 1: t abdomen supine · 0.14mm/px · 2 of 2 slices shown]
[im 1/2]
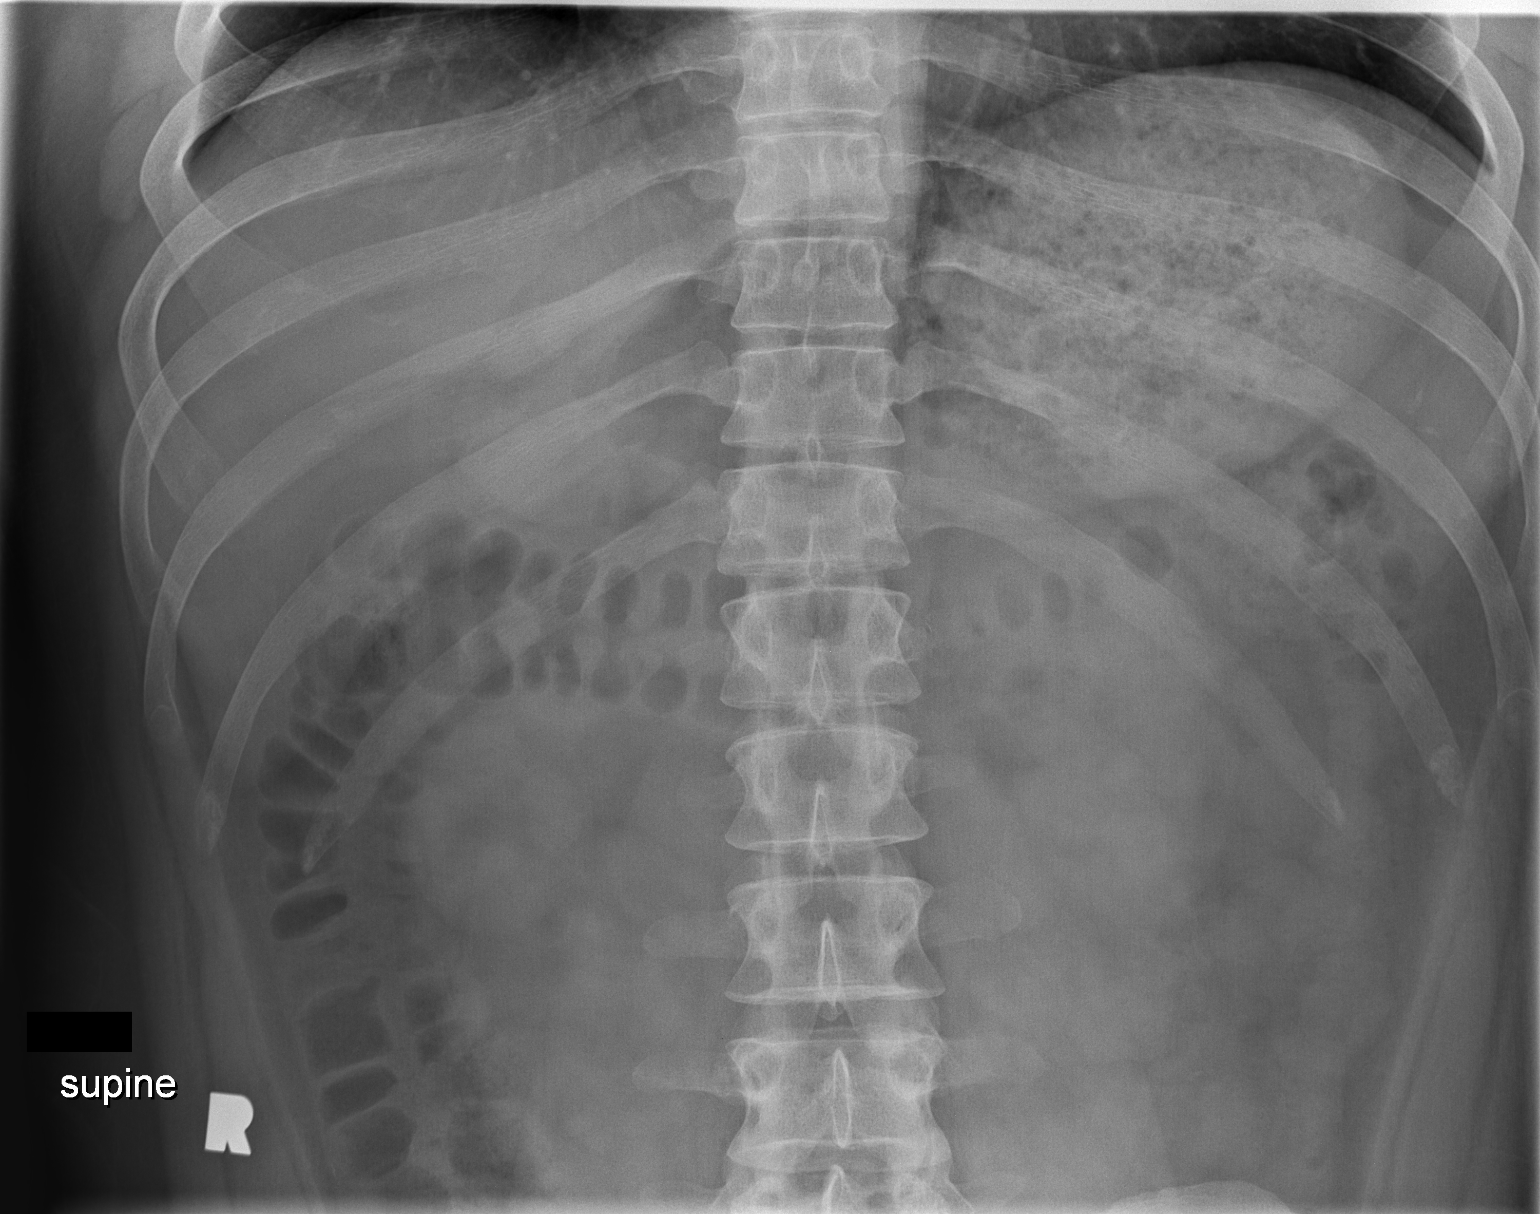
[im 2/2]
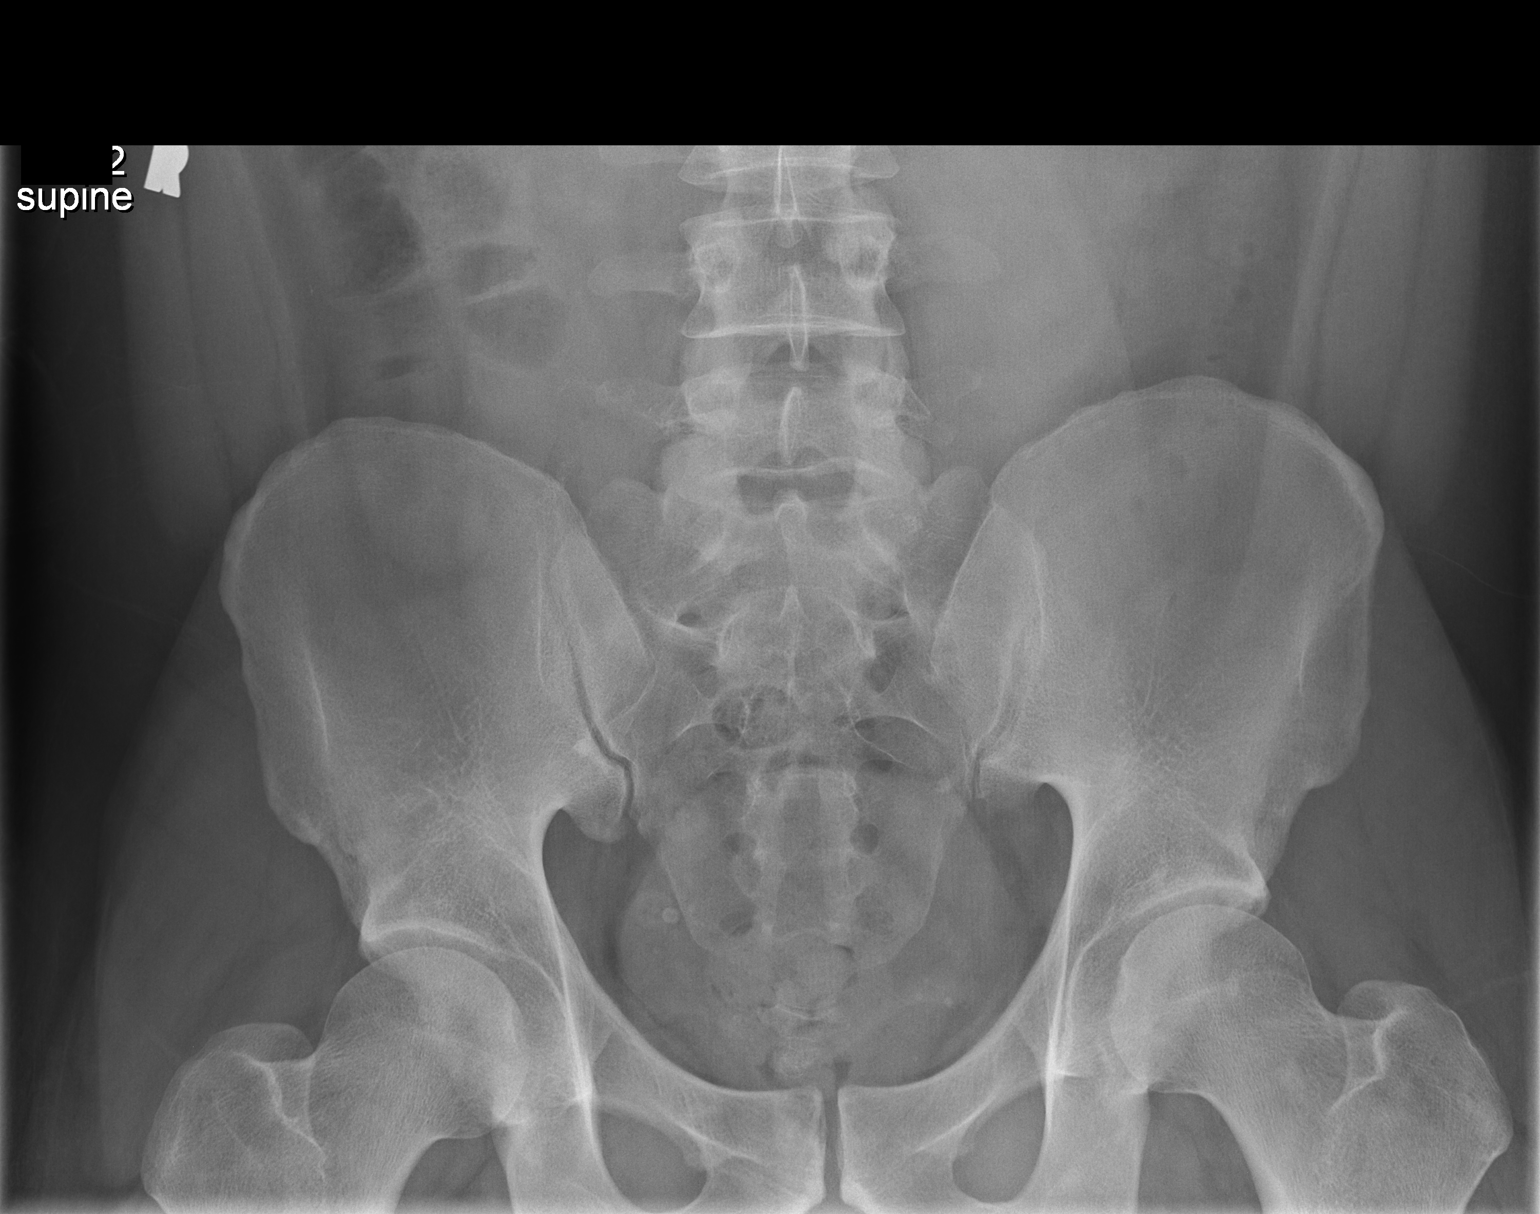

[2 of 2 positions shown; findings below may reference images not displayed]

FINDINGS: The bowel gas pattern is normal. No radio-opaque calculi or other
significant radiographic abnormality are seen. Phleboliths in the
pelvis.
IMPRESSION: Negative.

## 2021-04-15 ENCOUNTER — Telehealth: Payer: Self-pay

## 2021-04-15 DIAGNOSIS — K2900 Acute gastritis without bleeding: Secondary | ICD-10-CM | POA: Diagnosis not present

## 2021-04-15 NOTE — Telephone Encounter (Signed)
Alex called to report intermittent nausea x 2 weeks. Family experienced stomach virus, but his symptoms have not resolved. He stated he will try [facility] today for virtual visit but will call clinic if he should need further support.

## 2021-04-27 ENCOUNTER — Other Ambulatory Visit: Payer: Self-pay

## 2021-04-27 ENCOUNTER — Ambulatory Visit: Payer: Self-pay | Admitting: Nurse Practitioner

## 2021-04-27 VITALS — BP 130/85 | HR 82 | Temp 98.6°F | Resp 14 | Ht 70.0 in | Wt 220.0 lb

## 2021-04-27 DIAGNOSIS — K219 Gastro-esophageal reflux disease without esophagitis: Secondary | ICD-10-CM

## 2021-04-27 MED ORDER — FAMOTIDINE 20 MG PO TABS: 20.0000 mg | ORAL_TABLET | Freq: Two times a day (BID) | ORAL | 0 refills | Status: DC | PRN

## 2021-04-27 MED ORDER — PANTOPRAZOLE SODIUM 40 MG PO TBEC: 40.0000 mg | DELAYED_RELEASE_TABLET | Freq: Every day | ORAL | 1 refills | Status: DC

## 2021-04-27 NOTE — Progress Notes (Signed)
Nausea and burning sensation in stomach started a month ago. Pt hasn't noticed anything that makes it worse besides  unless he doesn't eat at all. CL,RMA

## 2021-04-27 NOTE — Progress Notes (Signed)
Subjective:    Patient ID: Jacob Payne, male    DOB: 1991/11/04, 30 y.o.   MRN: [id number]  HPI  30 year old male presenting to [facility] with complaints of one month of nausea. Patient notes about one month ago he awoke suddenly in the middle of the night and had painful diarrhea. For one week after that episode he had black tarry stools and nausea especially after eating. Symptoms resolved briefly and then returned about a week after and were brought on after eating a hamburger. Denies any further diarrhea or black stools. Symptoms now are ongoing bloating lower abdominal burning and nausea. Spicy foods do worsen symptoms. Red meat is not always a trigger   He has had reflux in the past.   Has not vomited throughout illness Notes for about the past 2 years he has noted that an hour after lunch and dinner he will have to have a BM.   He has cut back on soda significantly only has 1-2 a week now. Denies any ETOH or tobacco use.   Went to a telehealth MD about two weeks ago and was given a Prevacid Rx that has provided little relief.   Prior to the onset of these symptoms patient was promoted to [employer] and started working night shift. The diarrhea episode started his last night/week of nights. He has since been moved back to day shift.   Denies a family history of gastric CA Dad- DM Grandfather had ulcers  Today's Vitals   04/27/21 1124 04/27/21 1132  BP: (!) 126/98 130/85  Pulse: 82   Resp: 14   Temp: 98.6 F (37 C)   SpO2: 100%   Weight: 220 lb (99.8 kg)   Height: 5\' 10"  (1.778 m)    Body mass index is 31.57 kg/m.  Review of Systems  Constitutional: Negative.   HENT: Negative.   Respiratory: Negative.   Cardiovascular: Negative.   Gastrointestinal: Positive for nausea.  Endocrine: Negative.   Genitourinary: Negative.   Musculoskeletal: Negative.   Allergic/Immunologic: Negative.   Neurological: Negative.   Hematological: Negative.        Objective:    Physical Exam Constitutional:      Appearance: Normal appearance.  HENT:     Mouth/Throat:     Mouth: Mucous membranes are moist.     Pharynx: Oropharynx is clear.  Abdominal:     General: Bowel sounds are normal. There is no distension.     Palpations: Abdomen is soft. There is no mass.     Tenderness: There is no abdominal tenderness. There is no guarding or rebound.  Skin:    General: Skin is warm.  Neurological:     General: No focal deficit present.     Mental Status: He is alert.  Psychiatric:        Mood and Affect: Mood normal.           Assessment & Plan:  Reviewed recent imaging with patient  Advised 8 weeks of PPI and as needed Pepcid.  Reviewed GERD diet Offered GI referral patient would like to start with medication regimen- will RTC in June for annual physical and may re address need for referral to GI if symptoms persist.   RTC earlier with any worsening symptoms   Meds ordered this encounter  Medications  . pantoprazole (PROTONIX) 40 MG tablet    Sig: Take 1 tablet (40 mg total) by mouth daily.    Dispense:  30 tablet    Refill:  1  . famotidine (PEPCID) 20 MG tablet    Sig: Take 1 tablet (20 mg total) by mouth 2 (two) times daily between meals as needed for heartburn or indigestion.    Dispense:  60 tablet    Refill:  0

## 2021-04-27 NOTE — Patient Instructions (Signed)
Gastroesophageal Reflux Disease, Adult  Gastroesophageal reflux (GER) happens when acid from the stomach flows up into the tube that connects the mouth and the stomach (esophagus). Normally, food travels down the esophagus and stays in the stomach to be digested. With GER, food and stomach acid sometimes move back up into the esophagus. You may have a disease called gastroesophageal reflux disease (GERD) if the reflux:  Happens often.  Causes frequent or very bad symptoms.  Causes problems such as damage to the esophagus. When this happens, the esophagus becomes sore and swollen. Over time, GERD can make small holes (ulcers) in the lining of the esophagus. What are the causes? This condition is caused by a problem with the muscle between the esophagus and the stomach. When this muscle is weak or not normal, it does not close properly to keep food and acid from coming back up from the stomach. The muscle can be weak because of:  Certain foods and drinks, such as coffee, chocolate, onions, and peppermint. What increases the risk?  Being overweight.  Having a disease that affects your connective tissue.  Taking NSAIDs, such a ibuprofen.  Treatment will depend on how bad your symptoms are. Follow these instructions at home: Eating and drinking  Follow a diet as told by your doctor. You may need to avoid foods and drinks such as: ? Coffee and tea, with or without caffeine. ? Drinks that contain alcohol. ? Energy drinks and sports drinks. ? Bubbly (carbonated) drinks or sodas. ? Chocolate and cocoa. ? Peppermint and mint flavorings. ? Garlic and onions. ? Horseradish. ? Spicy and acidic foods. These include peppers, chili powder, curry powder, vinegar, hot sauces, and BBQ sauce. ? Citrus fruit juices and citrus fruits, such as oranges, lemons, and limes. ? Tomato-based foods. These include red sauce, chili, salsa, and pizza with red sauce. ? Fried and fatty foods. These include  donuts, french fries, potato chips, and high-fat dressings. ? High-fat meats. These include hot dogs, rib eye steak, sausage, ham, and bacon. ? High-fat dairy items, such as whole milk, butter, and cream cheese.  Eat small meals often. Avoid eating large meals.  Avoid drinking large amounts of liquid with your meals.  Avoid eating meals during the 2-3 hours before bedtime.  Avoid lying down right after you eat.  Do not exercise right after you eat.   Lifestyle  Do not smoke or use any products that contain nicotine or tobacco. If you need help quitting, ask your doctor.  Try to lower your stress. If you need help doing this, ask your doctor.  If you are overweight, lose an amount of weight that is healthy for you. Ask your doctor about a safe weight loss goal.   General instructions  Pay attention to any changes in your symptoms.  Take over-the-counter and prescription medicines only as told by your doctor.  Do not take aspirin, ibuprofen, or other NSAIDs unless your doctor says it is okay.  Wear loose clothes. Do not wear anything tight around your waist.  Raise (elevate) the head of your bed about 6 inches (15 cm). You may need to use a wedge to do this.  Avoid bending over if this makes your symptoms worse.  Keep all follow-up visits. Contact a doctor if:  You have new symptoms.  You lose weight and you do not know why.  You have trouble swallowing or it hurts to swallow.  You have wheezing or a cough that keeps happening.  You have  a hoarse voice.  Your symptoms do not get better with treatment. Get help right away if:  You have sudden pain in your arms, neck, jaw, teeth, or back.  You suddenly feel sweaty, dizzy, or light-headed.  You have chest pain or shortness of breath.  You vomit and the vomit is green, yellow, or black, or it looks like blood or coffee grounds.  You faint.  Your poop (stool) is red, bloody, or black.  You cannot swallow, drink,  or eat. These symptoms may represent a serious problem that is an emergency. Do not wait to see if the symptoms will go away. Get medical help right away. Call your local emergency services (911 in the [place] Do not drive yourself to the hospital. Summary  If a person has gastroesophageal reflux disease (GERD), food and stomach acid move back up into the esophagus and cause symptoms or problems such as damage to the esophagus.  Treatment will depend on how bad your symptoms are.  Follow a diet as told by your doctor.  Take all medicines only as told by your doctor. This information is not intended to replace advice given to you by your health care provider. Make sure you discuss any questions you have with your health care provider. Document Revised: 06/15/2020 Document Reviewed: 06/15/2020 Elsevier Patient Education  2021 [employer].

## 2021-05-18 ENCOUNTER — Other Ambulatory Visit: Payer: Self-pay | Admitting: Nurse Practitioner

## 2021-05-18 DIAGNOSIS — K219 Gastro-esophageal reflux disease without esophagitis: Secondary | ICD-10-CM

## 2021-05-18 NOTE — Progress Notes (Signed)
Patient with persistent GI issues- on PPI with antacid as needed will refer to GI as requested and previously recommended.

## 2021-05-21 NOTE — Progress Notes (Signed)
Pt scheduled to complete annual physical 05/31/21 with Mila Merry CL,RMA  Pt aware of Hep C and HIV screening and agreed to both. Pt will update with covid booster vaccine date./CL,RMA

## 2021-05-24 ENCOUNTER — Other Ambulatory Visit: Payer: Self-pay

## 2021-05-24 ENCOUNTER — Ambulatory Visit: Payer: Self-pay

## 2021-05-24 DIAGNOSIS — Z0000 Encounter for general adult medical examination without abnormal findings: Secondary | ICD-10-CM

## 2021-05-25 LAB — CMP12+LP+TP+TSH+6AC+CBC/D/PLT
Basophils Absolute: 0 10*3/uL (ref 0.0–0.2)
Basos: 0 %
EOS (ABSOLUTE): 0.1 10*3/uL (ref 0.0–0.4)
Eos: 2 %
Hematocrit: 49 % (ref 37.5–51.0)
Hemoglobin: 16.8 g/dL (ref 13.0–17.7)
Immature Grans (Abs): 0 10*3/uL (ref 0.0–0.1)
Immature Granulocytes: 0 %
Lymphocytes Absolute: 1.9 10*3/uL (ref 0.7–3.1)
Lymphs: 37 %
MCH: 30.2 pg (ref 26.6–33.0)
MCHC: 34.3 g/dL (ref 31.5–35.7)
MCV: 88 fL (ref 79–97)
Monocytes Absolute: 0.4 10*3/uL (ref 0.1–0.9)
Monocytes: 7 %
Neutrophils Absolute: 2.7 10*3/uL (ref 1.4–7.0)
Neutrophils: 54 %
Platelets: 194 10*3/uL (ref 150–450)
RBC: 5.56 x10E6/uL (ref 4.14–5.80)
RDW: 12.2 % (ref 11.6–15.4)
WBC: 5.1 10*3/uL (ref 3.4–10.8)

## 2021-05-25 LAB — HIV ANTIBODY (ROUTINE TESTING W REFLEX): HIV Screen 4th Generation wRfx: NONREACTIVE

## 2021-05-25 LAB — HCV INTERPRETATION

## 2021-05-25 LAB — HCV AB W REFLEX TO QUANT PCR: HCV Ab: <0.1 s/co ratio (ref 0.0–0.9)

## 2021-05-29 LAB — CMP12+LP+TP+TSH+6AC+CBC/D/PLT
ALT: 49 IU/L — ABNORMAL HIGH (ref 0–44)
AST: 26 IU/L (ref 0–40)
Albumin/Globulin Ratio: 1.9 (ref 1.2–2.2)
Albumin: 5.2 g/dL (ref 4.1–5.2)
Alkaline Phosphatase: 82 IU/L (ref 44–121)
BUN/Creatinine Ratio: 11 (ref 9–20)
BUN: 14 mg/dL (ref 6–20)
Bilirubin Total: 0.3 mg/dL (ref 0.0–1.2)
Calcium: 9.8 mg/dL (ref 8.7–10.2)
Chloride: 101 mmol/L (ref 96–106)
Chol/HDL Ratio: 4.1 ratio (ref 0.0–5.0)
Cholesterol, Total: 180 mg/dL (ref 100–199)
Creatinine, Ser: 1.22 mg/dL (ref 0.76–1.27)
Estimated CHD Risk: 0.8 times avg. (ref 0.0–1.0)
Free Thyroxine Index: 2.5 (ref 1.2–4.9)
GGT: 20 IU/L (ref 0–65)
Globulin, Total: 2.7 g/dL (ref 1.5–4.5)
Glucose: 109 mg/dL — ABNORMAL HIGH (ref 65–99)
HDL: 44 mg/dL (ref >39)
Iron: 134 ug/dL (ref 38–169)
LDH: 177 IU/L (ref 121–224)
LDL Chol Calc (NIH): 102 mg/dL — ABNORMAL HIGH (ref 0–99)
Phosphorus: 3.7 mg/dL (ref 2.8–4.1)
Potassium: 4.4 mmol/L (ref 3.5–5.2)
Sodium: 142 mmol/L (ref 134–144)
T3 Uptake Ratio: 27 % (ref 24–39)
T4, Total: 9.4 ug/dL (ref 4.5–12.0)
TSH: 3.91 u[IU]/mL (ref 0.450–4.500)
Total Protein: 7.9 g/dL (ref 6.0–8.5)
Triglycerides: 197 mg/dL — ABNORMAL HIGH (ref 0–149)
Uric Acid: 6.3 mg/dL (ref 3.8–8.4)
VLDL Cholesterol Cal: 34 mg/dL (ref 5–40)
eGFR: 82 mL/min/{1.73_m2} (ref >59)

## 2021-05-29 LAB — SPECIMEN STATUS REPORT

## 2021-05-31 ENCOUNTER — Encounter: Payer: Self-pay | Admitting: Adult Health

## 2021-05-31 ENCOUNTER — Ambulatory Visit: Payer: Self-pay | Admitting: Adult Health

## 2021-05-31 ENCOUNTER — Other Ambulatory Visit: Payer: Self-pay

## 2021-05-31 VITALS — BP 130/84 | HR 68 | Temp 98.4°F | Resp 12 | Ht 70.0 in | Wt 215.0 lb

## 2021-05-31 DIAGNOSIS — Z0000 Encounter for general adult medical examination without abnormal findings: Secondary | ICD-10-CM

## 2021-05-31 DIAGNOSIS — R7309 Other abnormal glucose: Secondary | ICD-10-CM

## 2021-05-31 DIAGNOSIS — E785 Hyperlipidemia, unspecified: Secondary | ICD-10-CM

## 2021-05-31 LAB — POCT GLYCOSYLATED HEMOGLOBIN (HGB A1C): Hemoglobin A1C: 5.3 % (ref 4.0–5.6)

## 2021-05-31 NOTE — Progress Notes (Signed)
[facility] [address] [place], [place] [address]  Internal MEDICINE  Office Visit Note  Patient Name: Jacob Payne  [id number]  [id number]  Date of Service: 05/31/2021  Chief Complaint  Patient presents with   Annual Exam     HPI Pt is here for routine health maintenance examination. He is a well appearing 30 yo male.  He is an 37 year veteran [employer].  He is currently engaged and has no children.  His history is unremarkable.  He has been having some intermittent nausea, but denies any other stomach complaints, he has an appointment to see GI in August. He has been unable to exercise very much do to his nausea.  He is playing softball once a week. He denies any Tobacco, alcohol or illicit drug use.   Current Medication: Outpatient Encounter Medications as of 05/31/2021  Medication Sig Note   pantoprazole (PROTONIX) 40 MG tablet Take 1 tablet (40 mg total) by mouth daily.    famotidine (PEPCID) 20 MG tablet Take 1 tablet (20 mg total) by mouth 2 (two) times daily between meals as needed for heartburn or indigestion. 05/31/2021: Takes prn   No facility-administered encounter medications on file as of 05/31/2021.    Surgical History: Past Surgical History:  Procedure Laterality Date   APPENDECTOMY     CYSTOSCOPY W/ RETROGRADES Left 04/21/2020   Procedure: CYSTOSCOPY WITH RETROGRADE PYELOGRAM;  Surgeon: Abbie Sons, MD;  Location: [facility];  Service: Urology;  Laterality: Left;   CYSTOSCOPY WITH URETEROSCOPY, STONE BASKETRY AND STENT PLACEMENT Left 04/21/2020   Procedure: CYSTOSCOPY WITH URETEROSCOPY, STONE BASKETRY AND STENT PLACEMENT;  Surgeon: Abbie Sons, MD;  Location: [facility];  Service: Urology;  Laterality: Left;   EXTRACORPOREAL SHOCK WAVE LITHOTRIPSY Left 04/16/2020   Procedure: EXTRACORPOREAL SHOCK WAVE LITHOTRIPSY (ESWL);  Surgeon: Abbie Sons, MD;  Location: [facility];  Service: Urology;  Laterality: Left;    Medical History: Past  Medical History:  Diagnosis Date   Elevated fasting glucose    Elevated LFTs    Overweight     Family History: Family History  Problem Relation Age of Onset   Diabetes Father    Cancer Maternal Grandfather     Social History: Social History   Socioeconomic History   Marital status: Single    Spouse name: Not on file   Number of children: Not on file   Years of education: Not on file   Highest education level: Not on file  Occupational History   Not on file  Tobacco Use   Smoking status: Never   Smokeless tobacco: Never  Substance and Sexual Activity   Alcohol use: Never   Drug use: Never   Sexual activity: Not on file  Other Topics Concern   Not on file  Social History Narrative   Not on file   Social Determinants of Health   Financial Resource Strain: Not on file  Food Insecurity: Not on file  Transportation Needs: Not on file  Physical Activity: Not on file  Stress: Not on file  Social Connections: Not on file      Review of Systems  Constitutional:  Negative for activity change, appetite change and fatigue.  HENT:  Negative for congestion, sinus pain, trouble swallowing and voice change.   Eyes:  Negative for pain, discharge and visual disturbance.  Respiratory:  Negative for cough, chest tightness and shortness of breath.   Cardiovascular:  Negative for chest pain and leg swelling.  Gastrointestinal:  Negative for abdominal distention, abdominal pain, constipation and diarrhea.  Genitourinary:  Negative for difficulty urinating, hematuria, penile pain and testicular pain.  Musculoskeletal:  Negative for arthralgias, back pain and neck pain.  Skin:  Negative for color change.  Neurological:  Negative for dizziness, weakness and headaches.  Hematological:  Negative for adenopathy.  Psychiatric/Behavioral:  Negative for agitation, confusion and suicidal ideas.     Vital Signs: BP 130/84 (BP Location: Left Arm, Patient Position: Sitting, Cuff Size:  Large)   Pulse 68   Temp 98.4 F (36.9 C) (Temporal)   Resp 12   Ht _0  (1.778 m)   Wt 215 lb (97.5 kg)   SpO2 99%   BMI 30.85 kg/m    Physical Exam Constitutional:      Appearance: Normal appearance.  HENT:     Head: Normocephalic.     Right Ear: Tympanic membrane normal.     Left Ear: Tympanic membrane normal.     Nose: Nose normal.     Mouth/Throat:     Mouth: Mucous membranes are moist.     Pharynx: No oropharyngeal exudate or posterior oropharyngeal erythema.  Eyes:     General:        Right eye: No discharge.        Left eye: No discharge.     Extraocular Movements: Extraocular movements intact.     Pupils: Pupils are equal, round, and reactive to light.  Cardiovascular:     Rate and Rhythm: Normal rate and regular rhythm.     Pulses: Normal pulses.     Heart sounds: Normal heart sounds. No murmur heard. Pulmonary:     Effort: Pulmonary effort is normal. No respiratory distress.     Breath sounds: Normal breath sounds. No wheezing or rhonchi.  Abdominal:     General: Abdomen is flat. Bowel sounds are normal. There is no distension.     Palpations: There is no mass.     Tenderness: There is no abdominal tenderness. There is no guarding.     Hernia: No hernia is present. There is no hernia in the left inguinal area or right inguinal area.  Genitourinary:    Pubic Area: No rash.      Penis: Normal and circumcised.      Testes: Normal.        Right: Mass, tenderness or swelling not present.        Left: Mass, tenderness or swelling not present.     Epididymis:     Right: Not inflamed or enlarged.     Left: Not inflamed or enlarged.  Musculoskeletal:        General: No swelling or deformity. Normal range of motion.     Cervical back: Normal range of motion.  Lymphadenopathy:     Lower Body: No right inguinal adenopathy. No left inguinal adenopathy.  Skin:    General: Skin is warm and dry.     Capillary Refill: Capillary refill takes less than 2 seconds.   Neurological:     General: No focal deficit present.     Mental Status: He is alert.     Cranial Nerves: No cranial nerve deficit.     Gait: Gait normal.  Psychiatric:        Mood and Affect: Mood normal.        Behavior: Behavior normal.        Judgment: Judgment normal.     LABS: Recent Results (from the past 2160 hour(s))  CMP12+LP+TP+TSH+6AC+CBC/D/Plt     Status: None   Collection Time: 05/24/21  9:03 AM  Result Value Ref Range   Glucose CANCELED     Comment: Test not performed  Result canceled by the ancillary.    Uric Acid CANCELED     Comment: Test not performed  Result canceled by the ancillary.    BUN CANCELED     Comment: Test not performed  Result canceled by the ancillary.    Creatinine, Ser CANCELED     Comment: Test not performed  Result canceled by the ancillary.    Sodium CANCELED     Comment: Test not performed  Result canceled by the ancillary.    Potassium CANCELED     Comment: Test not performed  Result canceled by the ancillary.    Chloride CANCELED     Comment: Test not performed  Result canceled by the ancillary.    Calcium CANCELED     Comment: Test not performed  Result canceled by the ancillary.    Phosphorus CANCELED     Comment: Test not performed  Result canceled by the ancillary.    Total Protein CANCELED     Comment: Test not performed  Result canceled by the ancillary.    Albumin CANCELED     Comment: Test not performed  Result canceled by the ancillary.    Bilirubin Total CANCELED     Comment: Test not performed  Result canceled by the ancillary.    Alkaline Phosphatase CANCELED     Comment: Test not performed  Result canceled by the ancillary.    LDH CANCELED     Comment: Test not performed  Result canceled by the ancillary.    AST CANCELED     Comment: Test not performed  Result canceled by the ancillary.    ALT CANCELED     Comment: Test not performed  Result canceled by the  ancillary.    GGT CANCELED     Comment: Test not performed  Result canceled by the ancillary.    Iron CANCELED     Comment: Test not performed  Result canceled by the ancillary.    Cholesterol, Total CANCELED     Comment: Test not performed  Result canceled by the ancillary.    Triglycerides CANCELED     Comment: Test not performed  Result canceled by the ancillary.    HDL CANCELED     Comment: Test not performed  Result canceled by the ancillary.    VLDL Cholesterol Cal CANCELED mg/dL    Comment: Unable to calculate result since non-numeric result obtained for component test.  Result canceled by the ancillary.    TSH CANCELED     Comment: Test not performed  Result canceled by the ancillary.    T4, Total CANCELED     Comment: Test not performed  Result canceled by the ancillary.    T3 Uptake Ratio CANCELED     Comment: Test not performed  Result canceled by the ancillary.    WBC 5.1 3.4 - 10.8 x10E3/uL   RBC 5.56 4.14 - 5.80 x10E6/uL   Hemoglobin 16.8 13.0 - 17.7 g/dL   Hematocrit 49.0 37.5 - 51.0 %   MCV 88 79 - 97 fL   MCH 30.2 26.6 - 33.0 pg   MCHC 34.3 31.5 - 35.7 g/dL   RDW 12.2 11.6 - 15.4 %   Platelets 194 150 - 450 x10E3/uL   Neutrophils 54 Not Estab. %   Lymphs 37 Not Estab. %  Monocytes 7 Not Estab. %   Eos 2 Not Estab. %   Basos 0 Not Estab. %   Neutrophils Absolute 2.7 1.4 - 7.0 x10E3/uL   Lymphocytes Absolute 1.9 0.7 - 3.1 x10E3/uL   Monocytes Absolute 0.4 0.1 - 0.9 x10E3/uL   EOS (ABSOLUTE) 0.1 0.0 - 0.4 x10E3/uL   Basophils Absolute 0.0 0.0 - 0.2 x10E3/uL   Immature Granulocytes 0 Not Estab. %   Immature Grans (Abs) 0.0 0.0 - 0.1 x10E3/uL  HIV Antibody (routine testing w rflx)     Status: None   Collection Time: 05/24/21  9:25 AM  Result Value Ref Range   HIV Screen 4th Generation wRfx Non Reactive Non Reactive    Comment: HIV Negative HIV-1/HIV-2 antibodies and HIV-1 p24 antigen were NOT detected. There is no laboratory  evidence of HIV infection.   HCV Ab w Reflex to Quant PCR     Status: None   Collection Time: 05/24/21  9:25 AM  Result Value Ref Range   HCV Ab <0.1 0.0 - 0.9 s/co ratio  Interpretation:     Status: None   Collection Time: 05/24/21  9:25 AM  Result Value Ref Range   HCV Interp 1: Comment     Comment: Negative Not infected with HCV, unless recent infection is suspected or other evidence exists to indicate HCV infection.   CMP12+LP+TP+TSH+6AC+CBC/D/Plt     Status: Abnormal   Collection Time: 05/24/21  9:25 AM  Result Value Ref Range   Glucose 109 (H) 65 - 99 mg/dL   Uric Acid 6.3 3.8 - 8.4 mg/dL    Comment:            Therapeutic target for gout patients: <6.0   BUN 14 6 - 20 mg/dL   Creatinine, Ser 1.22 0.76 - 1.27 mg/dL   eGFR 82 >59 mL/min/1.73   BUN/Creatinine Ratio 11 9 - 20   Sodium 142 134 - 144 mmol/L   Potassium 4.4 3.5 - 5.2 mmol/L   Chloride 101 96 - 106 mmol/L   Calcium 9.8 8.7 - 10.2 mg/dL   Phosphorus 3.7 2.8 - 4.1 mg/dL   Total Protein 7.9 6.0 - 8.5 g/dL   Albumin 5.2 4.1 - 5.2 g/dL   Globulin, Total 2.7 1.5 - 4.5 g/dL   Albumin/Globulin Ratio 1.9 1.2 - 2.2   Bilirubin Total 0.3 0.0 - 1.2 mg/dL   Alkaline Phosphatase 82 44 - 121 IU/L   LDH 177 121 - 224 IU/L   AST 26 0 - 40 IU/L   ALT 49 (H) 0 - 44 IU/L   GGT 20 0 - 65 IU/L   Iron 134 38 - 169 ug/dL   Cholesterol, Total 180 100 - 199 mg/dL   Triglycerides 197 (H) 0 - 149 mg/dL   HDL 44 >39 mg/dL   VLDL Cholesterol Cal 34 5 - 40 mg/dL   LDL Chol Calc (NIH) 102 (H) 0 - 99 mg/dL   Chol/HDL Ratio 4.1 0.0 - 5.0 ratio    Comment:                                   T. Chol/HDL Ratio                                             Men  Women  1/2 Avg.Risk  3.4    3.3                                   Avg.Risk  5.0    4.4                                2X Avg.Risk  9.6    7.1                                3X Avg.Risk 23.4   11.0    Estimated CHD Risk 0.8 0.0 - 1.0 times avg.     Comment: The CHD Risk is based on the T. Chol/HDL ratio. Other factors affect CHD Risk such as hypertension, smoking, diabetes, severe obesity, and family history of premature CHD.    TSH 3.910 0.450 - 4.500 uIU/mL   T4, Total 9.4 4.5 - 12.0 ug/dL   T3 Uptake Ratio 27 24 - 39 %   Free Thyroxine Index 2.5 1.2 - 4.9   WBC CANCELED x10E3/uL    Comment: Please refer to the following specimen for additional lab results.    SEE [phone]  Result canceled by the ancillary.    RBC CANCELED x10E6/uL    Comment: Please refer to the following specimen for additional lab results.    SEE [phone]  Result canceled by the ancillary.    Hemoglobin CANCELED g/dL    Comment: Please refer to the following specimen for additional lab results.    SEE [phone]  Result canceled by the ancillary.    Hematocrit CANCELED %    Comment: Please refer to the following specimen for additional lab results.    SEE [phone]  Result canceled by the ancillary.    MCV CANCELED fL    Comment: Please refer to the following specimen for additional lab results.    SEE [phone]  Result canceled by the ancillary.    MCH CANCELED pg    Comment: Please refer to the following specimen for additional lab results.    SEE [phone]  Result canceled by the ancillary.    MCHC CANCELED g/dL    Comment: Please refer to the following specimen for additional lab results.    SEE [phone]  Result canceled by the ancillary.    RDW CANCELED %    Comment: Please refer to the following specimen for additional lab results.    SEE [phone]  Result canceled by the ancillary.    Platelets CANCELED x10E3/uL    Comment: Please refer to the following specimen for additional lab results.    SEE [phone]  Result canceled by the ancillary.    Neutrophils CANCELED %    Comment: Please refer to the following specimen for additional lab  results.    SEE [phone]  Result canceled by the ancillary.    Lymphs CANCELED %    Comment: Please refer to the following specimen for additional lab results.    SEE [phone]  Result canceled by the ancillary.    Monocytes CANCELED %    Comment: Please refer to the following specimen for additional lab results.    SEE [phone]  Result canceled by the ancillary.    Eos CANCELED %    Comment: Please refer to the following specimen for additional lab results.  SEE [phone]  Result canceled by the ancillary.    Basos CANCELED %    Comment: Please refer to the following specimen for additional lab results.    SEE [phone]  Result canceled by the ancillary.    Immature Cells CANCELED     Comment: Please refer to the following specimen for additional lab results.    SEE [phone]  Result canceled by the ancillary.    Neutrophils Absolute CANCELED x10E3/uL    Comment: Please refer to the following specimen for additional lab results.    SEE [phone]  Result canceled by the ancillary.    Lymphocytes Absolute CANCELED x10E3/uL    Comment: Please refer to the following specimen for additional lab results.    SEE [phone]  Result canceled by the ancillary.    Monocytes Absolute CANCELED x10E3/uL    Comment: Please refer to the following specimen for additional lab results.    SEE [phone]  Result canceled by the ancillary.    EOS (ABSOLUTE) CANCELED x10E3/uL    Comment: Please refer to the following specimen for additional lab results.    SEE [phone]  Result canceled by the ancillary.    Basophils Absolute CANCELED x10E3/uL    Comment: Please refer to the following specimen for additional lab results.    SEE [phone]  Result canceled by the ancillary.    Immature Granulocytes CANCELED %    Comment: Please refer to the following specimen for additional lab  results.    SEE [phone]  Result canceled by the ancillary.    Immature Grans (Abs) CANCELED x10E3/uL    Comment: Please refer to the following specimen for additional lab results.    SEE [phone]  Result canceled by the ancillary.    NRBC CANCELED %    Comment: Please refer to the following specimen for additional lab results.    SEE [phone]  Result canceled by the ancillary.    Hematology Comments: CANCELED     Comment: Please refer to the following specimen for additional lab results.    SEE [phone]  Result canceled by the ancillary.   Specimen status report     Status: None   Collection Time: 05/24/21  9:25 AM  Result Value Ref Range   specimen status report Comment     Comment: Written Authorization Written Authorization Written Authorization Received. Authorization received from [place] 05-26-2021 Logged by Merri Brunette   POCT glycosylated hemoglobin (Hb A1C)     Status: None   Collection Time: 05/31/21  2:58 PM  Result Value Ref Range   Hemoglobin A1C 5.3 4.0 - 5.6 %   HbA1c POC (<> result, manual entry)     HbA1c, POC (prediabetic range)     HbA1c, POC (controlled diabetic range)      Assessment/Plan: 1. Annual physical exam Up to date on PHM.  No concerns at this time.  - POCT urinalysis dipstick  2. Elevated lipids Discussed Dietary modifications for elevated triglycerides.  Also discussed importance of exercise.  Recheck in 6-12 months.   3. Elevated glucose Fasting Glucose was 109, Father has DM. A1C is normal at 5.3 today. - POCT glycosylated hemoglobin (Hb A1C)     General Counseling: Eulice verbalizes understanding of the findings of todays visit and agrees with plan of treatment. I have discussed any further diagnostic evaluation that may be needed or ordered today. We also reviewed his medications today. he has been encouraged to call the office with any questions or concerns that should arise  related to todays visit.    Orders Placed This Encounter  Procedures   POCT glycosylated hemoglobin (Hb A1C)   POCT urinalysis dipstick    No orders of the defined types were placed in this encounter.   Total time spent:40 Minutes  Time spent includes review of chart, medications, test results, and follow up plan with the patient.    Kendell Bane AGNP-C Nurse Practitioner

## 2021-07-06 ENCOUNTER — Ambulatory Visit (INDEPENDENT_AMBULATORY_CARE_PROVIDER_SITE_OTHER): Payer: 59 | Admitting: Gastroenterology

## 2021-07-06 ENCOUNTER — Other Ambulatory Visit: Payer: Self-pay

## 2021-07-06 VITALS — BP 119/74 | HR 75 | Temp 98.6°F | Wt 212.8 lb

## 2021-07-06 DIAGNOSIS — R748 Abnormal levels of other serum enzymes: Secondary | ICD-10-CM | POA: Diagnosis not present

## 2021-07-06 DIAGNOSIS — R7989 Other specified abnormal findings of blood chemistry: Secondary | ICD-10-CM | POA: Diagnosis not present

## 2021-07-06 DIAGNOSIS — R1013 Epigastric pain: Secondary | ICD-10-CM

## 2021-07-06 NOTE — Patient Instructions (Addendum)
I have scheduled your ultrasound for July 29, 2021, arrive at 8:00 AM to register.   You cannot have anything to eat or drink after 12 midnight the night before.  Location is  [facility] Entrance [address] [place], [place], [place] [address]  If you need to reschedule, please call [phone]

## 2021-07-06 NOTE — Progress Notes (Signed)
Jacob Payne [address]  [address]  [place], [place] [address]  Main: [phone]  Fax: [phone]   Gastroenterology Consultation  Referring Provider:     Viviano Simas, FNP Primary Care Physician: [facility], Bl[facility] Ledger Reason for Consultation:     GERD        HPI:    Chief Complaint  Patient presents with   New Patient (Initial Visit)    Pt denies family Hx...   Gastroesophageal Reflux    Abd burning sensation and bloating, nausea and 1 episode of black tarry stools... Pt has not taken Protonix in a few days, but did not notice improvement in Sx while on for 2 months... Pt does take Tums and Prevacid PRN   Nausea    Almost daily...     Jacob Payne is a 30 y.o. y/o male referred for consultation & management  by Dr. Viviano Simas.  Patient reports 67-month history of epigastric burning sensation, intermittent, that gets better after a meal, and is worse on an empty stomach.  Associated with daily nausea.  Describes the pain as a burning sensation, 5/10, nonradiating.  No emesis.  No hematemesis.  No weight loss.  No dysphagia.  No prior EGD or colonoscopy.  No family history of GI malignancy.  Patient was taking Protonix for 60 days and states this did not improve his symptoms.  Does describe having an episode of melanotic stool when the symptoms first started 2 months ago and this self resolved.  Clearly states that the stools were black, sticky and tarry appearing at that time.  Denies any NSAID use.  Only uses Tylenol as needed occasionally.  He finished his Protonix 4 days ago.  No prior history of similar symptoms  Past Medical History:  Diagnosis Date   Elevated fasting glucose    Elevated LFTs    Overweight     Past Surgical History:  Procedure Laterality Date   APPENDECTOMY     CYSTOSCOPY W/ RETROGRADES Left 04/21/2020   Procedure: CYSTOSCOPY WITH RETROGRADE PYELOGRAM;  Surgeon: Riki Altes, MD;  Location: [facility]  ORS;  Service: Urology;  Laterality: Left;   CYSTOSCOPY WITH URETEROSCOPY, STONE BASKETRY AND STENT PLACEMENT Left 04/21/2020   Procedure: CYSTOSCOPY WITH URETEROSCOPY, STONE BASKETRY AND STENT PLACEMENT;  Surgeon: Riki Altes, MD;  Location: [facility] ORS;  Service: Urology;  Laterality: Left;   EXTRACORPOREAL SHOCK WAVE LITHOTRIPSY Left 04/16/2020   Procedure: EXTRACORPOREAL SHOCK WAVE LITHOTRIPSY (ESWL);  Surgeon: Riki Altes, MD;  Location: [facility] ORS;  Service: Urology;  Laterality: Left;    Prior to Admission medications   Medication Sig Start Date End Date Taking? Authorizing Provider  famotidine (PEPCID) 20 MG tablet Take 1 tablet (20 mg total) by mouth 2 (two) times daily between meals as needed for heartburn or indigestion. Patient not taking: Reported on 07/06/2021 04/27/21 05/27/21  Viviano Simas, FNP  pantoprazole (PROTONIX) 40 MG tablet Take 1 tablet (40 mg total) by mouth daily. Patient not taking: Reported on 07/06/2021 04/27/21 06/26/21  Viviano Simas, FNP    Family History  Problem Relation Age of Onset   Diabetes Father    Cancer Maternal Grandfather   H. pylori in mother  Social History   Tobacco Use   Smoking status: Never   Smokeless tobacco: Never  Substance Use Topics   Alcohol use: Never   Drug use: Never    Allergies as of 07/06/2021   (No Known Allergies)    Review  of Systems:    All systems reviewed and negative except where noted in HPI.   Physical Exam:  Constitutional: General:   Alert,  Well-developed, well-nourished, pleasant and cooperative in NAD BP 119/74   Pulse 75   Temp 98.6 F (37 C) (Oral)   Wt 212 lb 12.8 oz (96.5 kg)   BMI 30.53 kg/m   Eyes:  Sclera clear, no icterus.   Conjunctiva pink. PERRLA  Ears:  No scars, lesions or masses, Normal auditory acuity. Nose:  No deformity, discharge, or lesions. Mouth:  No deformity or lesions, oropharynx pink & moist.  Neck:  Supple; no masses or thyromegaly.  Respiratory: Normal  respiratory effort, Normal percussion  Gastrointestinal:  No bruits.  Soft, non-tender and non-distended without masses, hepatosplenomegaly or hernias noted.  No guarding or rebound tenderness.     Cardiac: No clubbing or edema.  No cyanosis. Normal posterior tibial pedal pulses noted.  Lymphatic:  No significant cervical or axillary adenopathy.  Psych:  Alert and cooperative. Normal mood and affect.  Musculoskeletal:  Normal gait. Head normocephalic, atraumatic. Symmetrical without gross deformities. 5/5 Upper and Lower extremity strength bilaterally.  Skin: Warm. Intact without significant lesions or rashes. No jaundice.  Neurologic:  Face symmetrical, tongue midline, Normal sensation to touch;  grossly normal neurologically.  Psych:  Alert and oriented x3, Alert and cooperative. Normal mood and affect.   Labs: CBC    Component Value Date/Time   WBC CANCELED 05/24/2021 0925   WBC 8.3 04/19/2020 2337   RBC CANCELED 05/24/2021 0925   RBC 5.42 04/19/2020 2337   HGB CANCELED 05/24/2021 0925   HCT CANCELED 05/24/2021 0925   PLT CANCELED 05/24/2021 0925   MCV CANCELED 05/24/2021 0925   MCH CANCELED 05/24/2021 0925   MCH 29.9 04/19/2020 2337   MCHC CANCELED 05/24/2021 0925   MCHC 33.8 04/19/2020 2337   RDW CANCELED 05/24/2021 0925   LYMPHSABS CANCELED 05/24/2021 0925   MONOABS 0.6 04/19/2020 2337   EOSABS CANCELED 05/24/2021 0925   BASOSABS CANCELED 05/24/2021 0925   CMP     Component Value Date/Time   NA 142 05/24/2021 0925   K 4.4 05/24/2021 0925   CL 101 05/24/2021 0925   CO2 25 04/19/2020 2337   GLUCOSE 109 (H) 05/24/2021 0925   GLUCOSE 102 (H) 04/19/2020 2337   BUN 14 05/24/2021 0925   CREATININE 1.22 05/24/2021 0925   CALCIUM 9.8 05/24/2021 0925   PROT 7.9 05/24/2021 0925   ALBUMIN 5.2 05/24/2021 0925   AST 26 05/24/2021 0925   ALT 49 (H) 05/24/2021 0925   ALKPHOS 82 05/24/2021 0925   BILITOT 0.3 05/24/2021 0925   GFRNONAA 84 05/12/2020 0853   GFRAA 98  05/12/2020 0853    Imaging Studies: CT renal stone study Dec 2021 IMPRESSION: No evidence of urolithiasis, hydronephrosis, or other acute findings.  December 2021 abdominal x-ray negative, with normal bowel gas pattern  Assessment and Plan:   Jacob Payne is a 30 y.o. y/o male has been referred for abdominal pain  Given patient's epigastric pain, persistent despite PPI therapy, further investigation especially for H. pylori is indicated at this time.  Patient took his last PPI dose 4 days ago, thus we will obtain serology at this time.  However, if this is negative and symptoms continue, would recommend another confirmatory test such as stool antigen or breath test in 2 weeks  Patient states his mother had H. pylori in the past  Will not refill PPI at this time as it  did not help the patient  Given an isolated episode of melena, will check CBC as well to see if he has anemia.  We may need to proceed with EGD depending on improvement in symptoms, and results of above work-up.  He is otherwise hemodynamically stable, with no further ongoing melena, and therefore no indication for urgent EGD at this time until work-up is complete.  Review of his record shows that patient has had persistent elevation in ALT dating back to 2021 at least.  Patient states this has not had any specific work-up or diagnosis before.  Obtain right upper quadrant ultrasound for evaluation of possible underlying fatty liver.  This would be unlikely to be causing his pain.  Pattern of elevation unlikely to be related to his pain and more likely to be related to benign fatty liver  Obtain blood work for fatty liver work-up as well and to evaluate for any other etiologies of elevated liver enzymes  His CT scan in December 2021 was otherwise reassuring with no concerning findings on hepatobiliary imaging, normal pancreas and normal stomach and bowel to explain his abdominal pain.    Dr Jacob Payne  Speech recognition software was used to dictate the above note.

## 2021-07-08 NOTE — Addendum Note (Signed)
Addended by: Melodie Bouillon on: 07/08/2021 12:56 PM   Modules accepted: Orders

## 2021-07-12 DIAGNOSIS — R748 Abnormal levels of other serum enzymes: Secondary | ICD-10-CM | POA: Diagnosis not present

## 2021-07-12 DIAGNOSIS — R109 Unspecified abdominal pain: Secondary | ICD-10-CM | POA: Diagnosis not present

## 2021-07-13 LAB — IRON AND TIBC
Iron Saturation: 40 % (ref 15–55)
Iron: 119 ug/dL (ref 38–169)
Total Iron Binding Capacity: 295 ug/dL (ref 250–450)
UIBC: 176 ug/dL (ref 111–343)

## 2021-07-16 LAB — IRON AND TIBC
Iron Saturation: 43 % (ref 15–55)
Iron: 123 ug/dL (ref 38–169)
Total Iron Binding Capacity: 287 ug/dL (ref 250–450)
UIBC: 164 ug/dL (ref 111–343)

## 2021-07-16 LAB — CERULOPLASMIN: Ceruloplasmin: 20.1 mg/dL (ref 16.0–31.0)

## 2021-07-16 LAB — HEPATITIS A ANTIBODY, TOTAL: hep A Total Ab: POSITIVE — AB

## 2021-07-16 LAB — ANTI-MICROSOMAL ANTIBODY LIVER / KIDNEY: LKM1 Ab: 1 Units (ref 0.0–20.0)

## 2021-07-16 LAB — HEMOCHROMATOSIS DNA-PCR(C282Y,H63D)

## 2021-07-16 LAB — ANA: ANA Titer 1: NEGATIVE

## 2021-07-16 LAB — IGG: IgG (Immunoglobin G), Serum: 1088 mg/dL (ref 603–1613)

## 2021-07-16 LAB — HEPATITIS B SURFACE ANTIBODY,QUALITATIVE: Hep B Surface Ab, Qual: REACTIVE

## 2021-07-16 LAB — MITOCHONDRIAL/SMOOTH MUSCLE AB PNL
Mitochondrial Ab: 30.7 Units — ABNORMAL HIGH (ref 0.0–20.0)
Smooth Muscle Ab: 4 Units (ref 0–19)

## 2021-07-16 LAB — FERRITIN: Ferritin: 642 ng/mL — ABNORMAL HIGH (ref 30–400)

## 2021-07-16 LAB — H PYLORI, IGM, IGG, IGA AB
H pylori, IgM Abs: <9 units (ref 0.0–8.9)
H. pylori, IgA Abs: <9 units (ref 0.0–8.9)
H. pylori, IgG AbS: 0.13 Index Value (ref 0.00–0.79)

## 2021-07-16 LAB — HEPATITIS B SURFACE ANTIGEN: Hepatitis B Surface Ag: NEGATIVE

## 2021-07-16 LAB — HEPATITIS B CORE ANTIBODY, TOTAL: Hep B Core Total Ab: NEGATIVE

## 2021-07-19 ENCOUNTER — Telehealth: Payer: Self-pay | Admitting: Gastroenterology

## 2021-07-19 NOTE — Telephone Encounter (Signed)
Patient is calling about lab results.

## 2021-07-20 ENCOUNTER — Other Ambulatory Visit: Payer: Self-pay | Admitting: Gastroenterology

## 2021-07-20 ENCOUNTER — Ambulatory Visit: Payer: 59 | Admitting: Gastroenterology

## 2021-07-20 DIAGNOSIS — R1013 Epigastric pain: Secondary | ICD-10-CM

## 2021-07-20 DIAGNOSIS — K921 Melena: Secondary | ICD-10-CM

## 2021-07-20 MED ORDER — OMEPRAZOLE 20 MG PO CPDR: 20.0000 mg | DELAYED_RELEASE_CAPSULE | Freq: Two times a day (BID) | ORAL | 0 refills | Status: DC

## 2021-07-20 NOTE — Addendum Note (Signed)
Addended by: Roena Malady on: 07/20/2021 05:21 PM   Modules accepted: Orders

## 2021-07-20 NOTE — Addendum Note (Signed)
Addended by: Roena Malady on: 07/20/2021 10:09 AM   Modules accepted: Orders

## 2021-07-23 ENCOUNTER — Encounter: Payer: Self-pay | Admitting: Internal Medicine

## 2021-07-23 ENCOUNTER — Inpatient Hospital Stay: Payer: 59

## 2021-07-23 ENCOUNTER — Inpatient Hospital Stay: Payer: 59 | Attending: Internal Medicine | Admitting: Internal Medicine

## 2021-07-23 DIAGNOSIS — R1013 Epigastric pain: Secondary | ICD-10-CM | POA: Diagnosis not present

## 2021-07-23 DIAGNOSIS — Z809 Family history of malignant neoplasm, unspecified: Secondary | ICD-10-CM | POA: Diagnosis not present

## 2021-07-23 DIAGNOSIS — K921 Melena: Secondary | ICD-10-CM | POA: Diagnosis not present

## 2021-07-23 NOTE — Assessment & Plan Note (Addendum)
#  Hereditary hemochromatosis-heterozygous; currently asymptomatic no evidence of organ dysfunction [see below].  Elevated ferritin-640; iron saturation 40%.  Discussed that ferritin is acute phase reactant-energy levels could be misleading in the context of hemochromatosis.   # I had a long discussion the patient regarding the natural history of untreated hereditary hemochromatosis-when unchecked iron levels could cause deposition in the skin/pigmentation; pancreas/diabetes; liver/cirrhosis; joints/arthralgias; cardiac/arrhythmia  etc. Also discussed above complications are extremely rare; and happen when extreme iron levels go unchecked for years.  In general the treatment is phlebotomy to keep ferritin 100 or less..  I do not expect such complications to happen given patient's heterozygous hemochromatosis.   #Abdominal discomfort/dyspepsia-I do not think patient's hemochromatosis diagnosis is a cause of the patient's symptoms.  Agree with further GI work-up as planned.   #Follow-up/surveillance: Patient states that he follows up with [facility] physician on a regular basis.  I think is reasonable to check iron studies ferritin on a yearly basis.  Again patient was asked to call us to discuss if he had any questions or concerns.  Thank you Dr.Tahiliani for allowing me to participate in the care of your pleasant patient. Please do not hesitate to contact me with questions or concerns in the interim.  DISPOSITION: # follow up as needed-Dr.B

## 2021-07-23 NOTE — Progress Notes (Signed)
New patient here for evaluation of abnormal labs. He has been experiencing intermittent nausea for the past several months and is being seen by GI.

## 2021-07-23 NOTE — Progress Notes (Signed)
[facility] CONSULT NOTE  Patient Care Team: Pcp, No as PCP - General  CHIEF COMPLAINTS/PURPOSE OF CONSULTATION: Hemochromatosis.   HEMATOLOGY HISTORY  #   HISTORY OF PRESENTING ILLNESS:  Jacob Payne 30 y.o.  male has been referred to [place] for further evaluation/work-up/ recommendations for elevated ferritin/hemochromatosis  Approximately 4 months ago patient noted to have an episode of diarrhea/nausea.  Over the last few months noted to have intermittent episodes of nausea/dyspepsia.  Improved with eating; worse with foods like pizza.  No weight loss.  No blood in stools or black or stools.  Patient was evaluated by GI for liver disease noted to have ferritin of 640-ordered hemochromatosis genotyping positive for heterozygous.  Review of Systems  Constitutional:  Negative for chills, diaphoresis, fever, malaise/fatigue and weight loss.  HENT:  Negative for nosebleeds and sore throat.   Eyes:  Negative for double vision.  Respiratory:  Negative for cough, hemoptysis, sputum production, shortness of breath and wheezing.   Cardiovascular:  Negative for chest pain, palpitations, orthopnea and leg swelling.  Gastrointestinal:  Positive for diarrhea, heartburn and nausea. Negative for blood in stool, constipation and melena.  Genitourinary:  Negative for dysuria, frequency and urgency.  Musculoskeletal:  Negative for back pain and joint pain.  Skin: Negative.  Negative for itching and rash.  Neurological:  Negative for dizziness, tingling, focal weakness, weakness and headaches.  Endo/Heme/Allergies:  Does not bruise/bleed easily.  Psychiatric/Behavioral:  Negative for depression. The patient is not nervous/anxious and does not have insomnia.    MEDICAL HISTORY:  Past Medical History:  Diagnosis Date   Elevated fasting glucose    Elevated LFTs    Overweight     SURGICAL HISTORY: Past Surgical History:  Procedure Laterality Date   APPENDECTOMY      CYSTOSCOPY W/ RETROGRADES Left 04/21/2020   Procedure: CYSTOSCOPY WITH RETROGRADE PYELOGRAM;  Surgeon: Riki Altes, MD;  Location: [facility] ORS;  Service: Urology;  Laterality: Left;   CYSTOSCOPY WITH URETEROSCOPY, STONE BASKETRY AND STENT PLACEMENT Left 04/21/2020   Procedure: CYSTOSCOPY WITH URETEROSCOPY, STONE BASKETRY AND STENT PLACEMENT;  Surgeon: Riki Altes, MD;  Location: [facility] ORS;  Service: Urology;  Laterality: Left;   EXTRACORPOREAL SHOCK WAVE LITHOTRIPSY Left 04/16/2020   Procedure: EXTRACORPOREAL SHOCK WAVE LITHOTRIPSY (ESWL);  Surgeon: Riki Altes, MD;  Location: [facility] ORS;  Service: Urology;  Laterality: Left;    SOCIAL HISTORY: Social History   Socioeconomic History   Marital status: Single    Spouse name: Not on file   Number of children: Not on file   Years of education: Not on file   Highest education level: Not on file  Occupational History   Not on file  Tobacco Use   Smoking status: Never   Smokeless tobacco: Never  Substance and Sexual Activity   Alcohol use: Never   Drug use: Never   Sexual activity: Not on file  Other Topics Concern   Not on file  Social History Narrative   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    Social Determinants of Health   Financial Resource Strain: Not on file  Food Insecurity: Not on file  Transportation Needs: Not on file  Physical Activity: Not on file  Stress: Not on file  Social Connections: Not on file  Intimate Partner Violence: Not on file    FAMILY HISTORY: Family History  Problem Relation Age of Onset   Diabetes Father    Cancer Maternal Grandfather  ALLERGIES:  has No Known Allergies.  MEDICATIONS:  Current Outpatient Medications  Medication Sig Dispense Refill   omeprazole (PRILOSEC) 20 MG capsule Take 1 capsule (20 mg total) by mouth 2 (two) times daily before a meal. 60 capsule 0   No current facility-administered medications for this visit.       PHYSICAL EXAMINATION:   Vitals:   07/23/21 1123  BP: 118/78  Pulse: 62  Resp: 16  Temp: 97.7 F (36.5 C)   Filed Weights   07/23/21 1123  Weight: 210 lb 4.8 oz (95.4 kg)    Physical Exam Vitals and nursing note reviewed.  Constitutional:      Comments: Patient resting in the bed.    Nasal cannula oxygen:   Accompanied by family  Alone  Patient sitting.   HENT:     Head: Normocephalic and atraumatic.     Mouth/Throat:     Mouth: Mucous membranes are moist.     Pharynx: No oropharyngeal exudate.  Eyes:     Pupils: Pupils are equal, round, and reactive to light.  Cardiovascular:     Rate and Rhythm: Normal rate and regular rhythm.  Pulmonary:     Effort: No respiratory distress.     Breath sounds: No wheezing.     Comments: Decreased breath sounds bilaterally at bases.  No wheeze or crackles Abdominal:     General: Bowel sounds are normal. There is no distension.     Palpations: Abdomen is soft. There is no mass.     Tenderness: no abdominal tenderness There is no guarding or rebound.  Musculoskeletal:        General: No tenderness. Normal range of motion.     Cervical back: Normal range of motion and neck supple.  Skin:    General: Skin is warm.  Neurological:     Mental Status: He is alert and oriented to person, place, and time.  Psychiatric:        Mood and Affect: Affect normal.        Judgment: Judgment normal.    LABORATORY DATA:  I have reviewed the data as listed Lab Results  Component Value Date   WBC CANCELED 05/24/2021   HGB CANCELED 05/24/2021   HCT CANCELED 05/24/2021   MCV CANCELED 05/24/2021   PLT CANCELED 05/24/2021   Recent Labs    05/24/21 0903 05/24/21 0925  NA CANCELED 142  K CANCELED 4.4  CL CANCELED 101  GLUCOSE CANCELED 109*  BUN CANCELED 14  CREATININE CANCELED 1.22  CALCIUM CANCELED 9.8  PROT CANCELED 7.9  ALBUMIN CANCELED 5.2  AST CANCELED 26  ALT CANCELED 49*  ALKPHOS CANCELED 82  BILITOT CANCELED  0.3     No results found.  Hemochromatosis, hereditary [facility]) #Hereditary hemochromatosis-heterozygous; currently asymptomatic no evidence of organ dysfunction [see below].  Elevated ferritin-640; iron saturation 40%.  Discussed that ferritin is acute phase reactant-energy levels could be misleading in the context of hemochromatosis.   # I had a long discussion the patient regarding the natural history of untreated hereditary hemochromatosis-when unchecked iron levels could cause deposition in the skin/pigmentation; pancreas/diabetes; liver/cirrhosis; joints/arthralgias; cardiac/arrhythmia  etc. Also discussed above complications are extremely rare; and happen when extreme iron levels go unchecked for years.  In general the treatment is phlebotomy to keep ferritin 100 or less..  I do not expect such complications to have been the patient's heterozygous hemochromatosis.   #Abdominal discomfort/dyspepsia-I do not think patient's hemochromatosis diagnosis is a cause of the patient's symptoms.  Agree with further GI work-up as planned.   #Follow-up/surveillance: Patient states that he follows up with [facility] physician on a regular basis.  I think is reasonable to check iron studies ferritin on a yearly basis.  Again patient was asked to call us to discuss if he had any questions or concerns.  Thank you Dr.Tahiliani for allowing me to participate in the care of your pleasant patient. Please do not hesitate to contact me with questions or concerns in the interim.  DISPOSITION: # follow up as needed-Dr.B    All questions were answered. The patient knows to call the clinic with any problems, questions or concerns.      Earna Coder, MD 07/23/2021 1:25 PM

## 2021-07-24 LAB — CBC
Hematocrit: 45.4 % (ref 37.5–51.0)
Hemoglobin: 15.8 g/dL (ref 13.0–17.7)
MCH: 31 pg (ref 26.6–33.0)
MCHC: 34.8 g/dL (ref 31.5–35.7)
MCV: 89 fL (ref 79–97)
Platelets: 187 10*3/uL (ref 150–450)
RBC: 5.09 x10E6/uL (ref 4.14–5.80)
RDW: 11.9 % (ref 11.6–15.4)
WBC: 5 10*3/uL (ref 3.4–10.8)

## 2021-07-24 LAB — COMPREHENSIVE METABOLIC PANEL
ALT: 29 IU/L (ref 0–44)
AST: 30 IU/L (ref 0–40)
Albumin/Globulin Ratio: 2.4 — ABNORMAL HIGH (ref 1.2–2.2)
Albumin: 5.2 g/dL (ref 4.1–5.2)
Alkaline Phosphatase: 64 IU/L (ref 44–121)
BUN/Creatinine Ratio: 8 — ABNORMAL LOW (ref 9–20)
BUN: 11 mg/dL (ref 6–20)
Bilirubin Total: 0.3 mg/dL (ref 0.0–1.2)
CO2: 24 mmol/L (ref 20–29)
Calcium: 10 mg/dL (ref 8.7–10.2)
Chloride: 101 mmol/L (ref 96–106)
Creatinine, Ser: 1.3 mg/dL — ABNORMAL HIGH (ref 0.76–1.27)
Globulin, Total: 2.2 g/dL (ref 1.5–4.5)
Glucose: 104 mg/dL — ABNORMAL HIGH (ref 65–99)
Potassium: 4.6 mmol/L (ref 3.5–5.2)
Sodium: 141 mmol/L (ref 134–144)
Total Protein: 7.4 g/dL (ref 6.0–8.5)
eGFR: 76 mL/min/{1.73_m2} (ref >59)

## 2021-07-29 ENCOUNTER — Other Ambulatory Visit: Payer: Self-pay

## 2021-07-29 ENCOUNTER — Ambulatory Visit
Admission: RE | Admit: 2021-07-29 | Discharge: 2021-07-29 | Disposition: A | Discharge status: Home / Self Care | Payer: 59 | Source: Ambulatory Visit | Attending: Gastroenterology | Admitting: Gastroenterology

## 2021-07-29 DIAGNOSIS — R1013 Epigastric pain: Secondary | ICD-10-CM | POA: Diagnosis not present

## 2021-08-10 ENCOUNTER — Telehealth: Payer: Self-pay | Admitting: Gastroenterology

## 2021-08-10 NOTE — Telephone Encounter (Signed)
Patient stated that he was returning your call

## 2021-08-11 ENCOUNTER — Other Ambulatory Visit: Payer: Self-pay | Admitting: Gastroenterology

## 2021-08-13 ENCOUNTER — Other Ambulatory Visit: Payer: Self-pay | Admitting: Gastroenterology

## 2021-08-13 DIAGNOSIS — R1013 Epigastric pain: Secondary | ICD-10-CM

## 2021-08-13 NOTE — Telephone Encounter (Signed)
Has been scheduled for 9/13 and instructions released to mychart and mailed to pt.Marland KitchenMarland Kitchen

## 2021-08-17 ENCOUNTER — Encounter: Payer: Self-pay | Admitting: Gastroenterology

## 2021-08-31 ENCOUNTER — Ambulatory Visit: Payer: 59 | Admitting: Anesthesiology

## 2021-08-31 ENCOUNTER — Encounter
Admission: RE | Disposition: A | Discharge status: Home / Self Care | Payer: Self-pay | Source: Home / Self Care | Attending: Gastroenterology

## 2021-08-31 ENCOUNTER — Other Ambulatory Visit: Payer: Self-pay

## 2021-08-31 ENCOUNTER — Encounter: Payer: Self-pay | Admitting: Gastroenterology

## 2021-08-31 ENCOUNTER — Ambulatory Visit
Admission: RE | Admit: 2021-08-31 | Discharge: 2021-08-31 | Disposition: A | Discharge status: Home / Self Care | Payer: 59 | Attending: Gastroenterology | Admitting: Gastroenterology

## 2021-08-31 DIAGNOSIS — R1084 Generalized abdominal pain: Secondary | ICD-10-CM | POA: Diagnosis not present

## 2021-08-31 DIAGNOSIS — Z79899 Other long term (current) drug therapy: Secondary | ICD-10-CM | POA: Diagnosis not present

## 2021-08-31 DIAGNOSIS — R1013 Epigastric pain: Secondary | ICD-10-CM | POA: Diagnosis not present

## 2021-08-31 DIAGNOSIS — R109 Unspecified abdominal pain: Secondary | ICD-10-CM | POA: Diagnosis not present

## 2021-08-31 DIAGNOSIS — K317 Polyp of stomach and duodenum: Secondary | ICD-10-CM | POA: Diagnosis not present

## 2021-08-31 DIAGNOSIS — D131 Benign neoplasm of stomach: Secondary | ICD-10-CM | POA: Diagnosis not present

## 2021-08-31 DIAGNOSIS — K449 Diaphragmatic hernia without obstruction or gangrene: Secondary | ICD-10-CM | POA: Insufficient documentation

## 2021-08-31 HISTORY — PX: ESOPHAGOGASTRODUODENOSCOPY (EGD) WITH PROPOFOL: SHX5813

## 2021-08-31 HISTORY — DX: Gastro-esophageal reflux disease without esophagitis: K21.9

## 2021-08-31 HISTORY — PX: POLYPECTOMY: SHX5525

## 2021-08-31 SURGERY — ESOPHAGOGASTRODUODENOSCOPY (EGD) WITH PROPOFOL
Anesthesia: General | Site: Esophagus

## 2021-08-31 MED ORDER — DEXMEDETOMIDINE (PRECEDEX) IN NS 20 MCG/5ML (4 MCG/ML) IV SYRINGE
PREFILLED_SYRINGE | INTRAVENOUS | Status: DC | PRN
  Administered 2021-08-31: 20 ug via INTRAVENOUS

## 2021-08-31 MED ORDER — GLYCOPYRROLATE 0.2 MG/ML IJ SOLN
INTRAMUSCULAR | Status: DC | PRN
  Administered 2021-08-31: .1 mg via INTRAVENOUS

## 2021-08-31 MED ORDER — LIDOCAINE HCL (CARDIAC) PF 100 MG/5ML IV SOSY
PREFILLED_SYRINGE | INTRAVENOUS | Status: DC | PRN
  Administered 2021-08-31: 40 mg via INTRAVENOUS

## 2021-08-31 MED ORDER — LACTATED RINGERS IV SOLN: INTRAVENOUS | Status: DC

## 2021-08-31 MED ORDER — SODIUM CHLORIDE 0.9 % IV SOLN: INTRAVENOUS | Status: DC

## 2021-08-31 MED ORDER — PROPOFOL 10 MG/ML IV BOLUS
INTRAVENOUS | Status: DC | PRN
  Administered 2021-08-31 (×2): 30 mg via INTRAVENOUS
  Administered 2021-08-31 (×2): 80 mg via INTRAVENOUS
  Administered 2021-08-31 (×2): 50 mg via INTRAVENOUS

## 2021-08-31 MED ORDER — STERILE WATER FOR IRRIGATION IR SOLN
Status: DC | PRN
  Administered 2021-08-31: 100 mL

## 2021-08-31 SURGICAL SUPPLY — 10 items
BLOCK BITE 60FR ADLT L/F GRN (MISCELLANEOUS) ×2 IMPLANT
FCP ESCP3.2XJMB 240X2.8X (MISCELLANEOUS) ×1
FORCEPS BIOP RJ4 240 W/NDL (MISCELLANEOUS) ×2
FORCEPS ESCP3.2XJMB 240X2.8X (MISCELLANEOUS) ×1 IMPLANT
GOWN CVR UNV OPN BCK APRN NK (MISCELLANEOUS) ×2 IMPLANT
GOWN ISOL THUMB LOOP REG UNIV (MISCELLANEOUS) ×4
KIT PRC NS LF DISP ENDO (KITS) ×1 IMPLANT
KIT PROCEDURE OLYMPUS (KITS) ×2
MANIFOLD NEPTUNE II (INSTRUMENTS) ×2 IMPLANT
WATER STERILE IRR 250ML POUR (IV SOLUTION) ×2 IMPLANT

## 2021-08-31 NOTE — H&P (Signed)
Melodie Bouillon, MD [address], [place], [place], [address] [address], [place], [place], [address] Phone: [phone]  Fax: [phone]  Primary Care Physician:  Pcp, No   Pre-Procedure History & Physical: HPI:  Jacob Payne is a 30 y.o. male is here for an EGD.   Past Medical History:  Diagnosis Date   Elevated fasting glucose    Elevated LFTs    GERD (gastroesophageal reflux disease)    Overweight     Past Surgical History:  Procedure Laterality Date   APPENDECTOMY     CYSTOSCOPY W/ RETROGRADES Left 04/21/2020   Procedure: CYSTOSCOPY WITH RETROGRADE PYELOGRAM;  Surgeon: Riki Altes, MD;  Location: [facility] ORS;  Service: Urology;  Laterality: Left;   CYSTOSCOPY WITH URETEROSCOPY, STONE BASKETRY AND STENT PLACEMENT Left 04/21/2020   Procedure: CYSTOSCOPY WITH URETEROSCOPY, STONE BASKETRY AND STENT PLACEMENT;  Surgeon: Riki Altes, MD;  Location: [facility] ORS;  Service: Urology;  Laterality: Left;   EXTRACORPOREAL SHOCK WAVE LITHOTRIPSY Left 04/16/2020   Procedure: EXTRACORPOREAL SHOCK WAVE LITHOTRIPSY (ESWL);  Surgeon: Riki Altes, MD;  Location: [facility] ORS;  Service: Urology;  Laterality: Left;    Prior to Admission medications   Medication Sig Start Date End Date Taking? Authorizing Provider  omeprazole (PRILOSEC) 20 MG capsule Take 1 capsule (20 mg total) by mouth 2 (two) times daily before a meal. 07/20/21  Yes Pasty Spillers, MD    Allergies as of 08/13/2021   (No Known Allergies)    Family History  Problem Relation Age of Onset   Diabetes Father    Cancer Maternal Grandfather     Social History   Socioeconomic History   Marital status: Single    Spouse name: Not on file   Number of children: Not on file   Years of education: Not on file   Highest education level: Not on file  Occupational History   Not on file  Tobacco Use   Smoking status: Never   Smokeless tobacco: Never  Substance and Sexual Activity    Alcohol use: Never   Drug use: Never   Sexual activity: Not on file  Other Topics Concern   Not on file  Social History Narrative   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    Social Determinants of Health   Financial Resource Strain: Not on file  Food Insecurity: Not on file  Transportation Needs: Not on file  Physical Activity: Not on file  Stress: Not on file  Social Connections: Not on file  Intimate Partner Violence: Not on file    Review of Systems: See HPI, otherwise negative ROS  Constitutional: General:   Alert,  Well-developed, well-nourished, pleasant and cooperative in NAD BP 131/89   Pulse 64   Temp 98.1 F (36.7 C) (Temporal)   Resp 20   Ht 5\' 9"  (1.753 m)   Wt 93.4 kg   SpO2 100%   BMI 30.42 kg/m   Head: Normocephalic, atraumatic.   Eyes:  Sclera clear, no icterus.   Conjunctiva pink.   Mouth:  No deformity or lesions, oropharynx pink & moist.  Neck:  Supple, trachea midline  Respiratory: Normal respiratory effort  Gastrointestinal:  Soft, non-tender and non-distended without masses, hepatosplenomegaly or hernias noted.  No guarding or rebound tenderness.     Cardiac: No clubbing or edema.  No cyanosis. Normal posterior tibial pedal pulses noted.  Lymphatic:  No significant cervical adenopathy.  Psych:  Alert and cooperative.  Normal mood and affect.  Musculoskeletal:   Symmetrical without gross deformities. 5/5 Lower extremity strength bilaterally.  Skin: Warm. Intact without significant lesions or rashes. No jaundice.  Neurologic:  Face symmetrical, tongue midline, Normal sensation to touch;  grossly normal neurologically.  Psych:  Alert and oriented x3, Alert and cooperative. Normal mood and affect.  Impression/Plan: Jacob Payne is here for an EGD for Abdominal pain  Risks, benefits, limitations, and alternatives regarding the procedure have been reviewed with the patient.  Questions have  been answered.  All parties agreeable.   Pasty Spillers, MD  08/31/2021, 10:28 AM

## 2021-08-31 NOTE — Op Note (Signed)
[facility] Gastroenterology Patient Name: Jacob Payne Procedure Date: 08/31/2021 10:31 AM MRN: [id number] Account #: [id number] Date of Birth: [birth year] Admit Type: Outpatient Age: 30 Room: [facility] OR ROOM 01 Gender: Male Note Status: Finalized Instrument Name: [id number] Procedure:             Upper GI endoscopy Indications:           Abdominal pain Providers:             Dakotah Heiman B. Maximino Greenland MD, MD Medicines:             Monitored Anesthesia Care Complications:         No immediate complications. Procedure:             Pre-Anesthesia Assessment:                        - Prior to the procedure, a History and Physical was                         performed, and patient medications, allergies and                         sensitivities were reviewed. The patient's tolerance                         of previous anesthesia was reviewed.                        - The risks and benefits of the procedure and the                         sedation options and risks were discussed with the                         patient. All questions were answered and informed                         consent was obtained.                        - Patient identification and proposed procedure were                         verified prior to the procedure by the physician, the                         nurse, the anesthesiologist, the anesthetist and the                         technician. The procedure was verified in the                         procedure room.                        - ASA Grade Assessment: II - A patient with mild                         systemic disease.  After obtaining informed consent, the endoscope was                         passed under direct vision. Throughout the procedure,                         the patient's blood pressure, pulse, and oxygen                         saturations were monitored continuously. The was                          introduced through the mouth, and advanced to the                         second part of duodenum. The upper GI endoscopy was                         accomplished with ease. The patient tolerated the                         procedure well. Findings:      The examined esophagus was normal.      A small hiatal hernia was present.      Multiple 4 to 5 mm sessile polyps with no bleeding and no stigmata of       recent bleeding were found in the gastric body. Biopsies were taken with       a cold forceps for histology.      The exam of the stomach was otherwise normal.      The entire examined stomach was normal. Biopsies were obtained in the       gastric body, at the incisura and in the gastric antrum with cold       forceps for histology. Biopsies were taken with a cold forceps for       Helicobacter pylori testing.      The duodenal bulb, second portion of the duodenum and examined duodenum       were normal. Impression:            - Normal esophagus.                        - Small hiatal hernia.                        - Multiple gastric polyps. Biopsied.                        - Normal stomach. Biopsied.                        - Normal duodenal bulb, second portion of the duodenum                         and examined duodenum.                        - Biopsies were obtained in the gastric body, at the  incisura and in the gastric antrum. Recommendation:        - Await pathology results.                        - Follow an antireflux regimen.                        - Discharge patient to home (with escort).                        - Advance diet as tolerated.                        - Continue present medications.                        - Patient has a contact number available for                         emergencies. The signs and symptoms of potential                         delayed complications were discussed with the patient.                         Return to  normal activities tomorrow. Written                         discharge instructions were provided to the patient.                        - Discharge patient to home (with escort).                        - The findings and recommendations were discussed with                         the patient.                        - The findings and recommendations were discussed with                         the patient's family. Procedure Code(s):     --- Professional ---                        [id number], Esophagogastroduodenoscopy, flexible,                         transoral; with biopsy, single or multiple Diagnosis Code(s):     --- Professional ---                        K44.9, Diaphragmatic hernia without obstruction or                         gangrene                        K31.7, Polyp of stomach and duodenum  R10.9, Unspecified abdominal pain CPT copyright 2019 American Medical Association. All rights reserved. The codes documented in this report are preliminary and upon coder review may  be revised to meet current compliance requirements.  Melodie Bouillon, MD Michel Bickers B. Maximino Greenland MD, MD 08/31/2021 10:56:11 AM This report has been signed electronically. Number of Addenda: 0 Note Initiated On: 08/31/2021 10:31 AM Total Procedure Duration: 0 hours 10 minutes 37 seconds  Estimated Blood Loss:  Estimated blood loss: none.      [facility]

## 2021-08-31 NOTE — Transfer of Care (Signed)
Immediate Anesthesia Transfer of Care Note  Patient: Jacob Payne  Procedure(s) Performed: ESOPHAGOGASTRODUODENOSCOPY (EGD) WITH PROPOFOL  Patient Location: PACU  Anesthesia Type: General  Level of Consciousness: awake, alert  and patient cooperative  Airway and Oxygen Therapy: Patient Spontanous Breathing and Patient connected to supplemental oxygen  Post-op Assessment: Post-op Vital signs reviewed, Patient's Cardiovascular Status Stable, Respiratory Function Stable, Patent Airway and No signs of Nausea or vomiting  Post-op Vital Signs: Reviewed and stable  Complications: No notable events documented.

## 2021-08-31 NOTE — Anesthesia Postprocedure Evaluation (Signed)
Anesthesia Post Note  Patient: Jacob Payne  Procedure(s) Performed: ESOPHAGOGASTRODUODENOSCOPY (EGD) WITH PROPOFOL (Esophagus) POLYPECTOMY (Esophagus)     Patient location during evaluation: PACU Anesthesia Type: General Level of consciousness: awake and alert Pain management: pain level controlled Vital Signs Assessment: post-procedure vital signs reviewed and stable Respiratory status: spontaneous breathing Cardiovascular status: stable Anesthetic complications: no   No notable events documented.  Marvis Repress

## 2021-08-31 NOTE — Anesthesia Preprocedure Evaluation (Signed)
Anesthesia Evaluation  Patient identified by MRN, date of birth, ID band Patient awake    Reviewed: Allergy & Precautions, H&P , NPO status , Patient's Chart, lab work & pertinent test results  Airway Mallampati: I  TM Distance: >3 FB Neck ROM: full    Dental no notable dental hx.    Pulmonary neg pulmonary ROS,    Pulmonary exam normal        Cardiovascular negative cardio ROS Normal cardiovascular exam Rhythm:regular Rate:Normal     Neuro/Psych negative neurological ROS  negative psych ROS   GI/Hepatic Neg liver ROS, Medicated,  Endo/Other  negative endocrine ROS  Renal/GU negative Renal ROS  negative genitourinary   Musculoskeletal   Abdominal   Peds  Hematology negative hematology ROS (+)   Anesthesia Other Findings   Reproductive/Obstetrics                             Anesthesia Physical Anesthesia Plan  ASA: 1  Anesthesia Plan: General   Post-op Pain Management:    Induction:   PONV Risk Score and Plan: 1 and Propofol infusion, TIVA and Treatment may vary due to age or medical condition  Airway Management Planned:   Additional Equipment:   Intra-op Plan:   Post-operative Plan:   Informed Consent: I have reviewed the patients History and Physical, chart, labs and discussed the procedure including the risks, benefits and alternatives for the proposed anesthesia with the patient or authorized representative who has indicated his/her understanding and acceptance.       Plan Discussed with:   Anesthesia Plan Comments:         Anesthesia Quick Evaluation

## 2021-09-01 ENCOUNTER — Encounter: Payer: Self-pay | Admitting: Gastroenterology

## 2021-09-01 ENCOUNTER — Ambulatory Visit: Payer: 59 | Admitting: Gastroenterology

## 2021-09-02 LAB — SURGICAL PATHOLOGY

## 2021-09-03 ENCOUNTER — Other Ambulatory Visit: Payer: Self-pay | Admitting: Gastroenterology

## 2021-09-13 ENCOUNTER — Other Ambulatory Visit: Payer: Self-pay | Admitting: Gastroenterology

## 2021-09-13 MED ORDER — ESOMEPRAZOLE MAGNESIUM 40 MG PO CPDR: 40.0000 mg | DELAYED_RELEASE_CAPSULE | Freq: Every day | ORAL | 0 refills | Status: DC

## 2021-10-02 DIAGNOSIS — N23 Unspecified renal colic: Secondary | ICD-10-CM | POA: Diagnosis not present

## 2021-12-28 ENCOUNTER — Ambulatory Visit: Payer: 59

## 2022-01-04 ENCOUNTER — Ambulatory Visit: Payer: Self-pay | Admitting: Physician Assistant

## 2022-01-04 ENCOUNTER — Other Ambulatory Visit: Payer: Self-pay

## 2022-01-04 DIAGNOSIS — J029 Acute pharyngitis, unspecified: Secondary | ICD-10-CM

## 2022-01-04 DIAGNOSIS — J020 Streptococcal pharyngitis: Secondary | ICD-10-CM

## 2022-01-04 LAB — POCT RAPID STREP A (OFFICE): Rapid Strep A Screen: POSITIVE — AB

## 2022-01-04 MED ORDER — AMOXICILLIN 875 MG PO TABS: 875.0000 mg | ORAL_TABLET | Freq: Two times a day (BID) | ORAL | 0 refills | Status: DC

## 2022-01-04 MED ORDER — LIDOCAINE VISCOUS HCL 2 % MT SOLN: 5.0000 mL | Freq: Four times a day (QID) | OROMUCOSAL | 0 refills | Status: DC | PRN

## 2022-01-04 MED ORDER — PSEUDOEPH-BROMPHEN-DM 30-2-10 MG/5ML PO SYRP: 5.0000 mL | ORAL_SOLUTION | Freq: Four times a day (QID) | ORAL | 0 refills | Status: DC | PRN

## 2022-01-04 NOTE — Progress Notes (Signed)
° °  Subjective: Sore throat    Patient ID: Jacob Payne, male    DOB: [birth year], 31 y.o.   MRN: [id number]  HPI Patient complain onset of sore throat today.  Patient exposed to strep pharyngitis by his wife last week.  Patient tested positive for rapid strep today.   Review of Systems Negative except for complaint.    Objective:   Physical Exam  This is a virtual visit.      Assessment & Plan:   Patient given discharge care instruction advised take medication as directed.  Prescription for amoxicillin, viscous lidocaine, and Bromfed-DM was sent to the pharmacy.

## 2022-01-04 NOTE — Progress Notes (Signed)
Presents to [facility] for outdoor specimen collection for rapid strep test.  Sore throat started yesterday  Denies fever  Rapid Strep Test = Positive  Wife was positive for strep last week & treated by our clinic.  AMD

## 2022-01-12 ENCOUNTER — Encounter: Payer: Self-pay | Admitting: Physician Assistant

## 2022-01-12 ENCOUNTER — Other Ambulatory Visit: Payer: Self-pay

## 2022-01-12 ENCOUNTER — Ambulatory Visit
Admission: RE | Admit: 2022-01-12 | Discharge: 2022-01-12 | Disposition: A | Discharge status: Home / Self Care | Payer: 59 | Source: Ambulatory Visit | Attending: Urology | Admitting: Urology

## 2022-01-12 ENCOUNTER — Encounter: Payer: Self-pay | Admitting: Urology

## 2022-01-12 ENCOUNTER — Ambulatory Visit (INDEPENDENT_AMBULATORY_CARE_PROVIDER_SITE_OTHER): Payer: 59 | Admitting: Urology

## 2022-01-12 ENCOUNTER — Ambulatory Visit
Admission: RE | Admit: 2022-01-12 | Discharge: 2022-01-12 | Disposition: A | Discharge status: Home / Self Care | Payer: 59 | Attending: Urology | Admitting: Urology

## 2022-01-12 ENCOUNTER — Ambulatory Visit: Payer: Self-pay | Admitting: Physician Assistant

## 2022-01-12 VITALS — BP 131/89 | HR 80 | Temp 98.6°F | Resp 14 | Ht 69.0 in | Wt 208.0 lb

## 2022-01-12 VITALS — BP 132/92 | HR 85 | Ht 69.0 in | Wt 206.0 lb

## 2022-01-12 DIAGNOSIS — N201 Calculus of ureter: Secondary | ICD-10-CM

## 2022-01-12 DIAGNOSIS — N200 Calculus of kidney: Secondary | ICD-10-CM

## 2022-01-12 DIAGNOSIS — R109 Unspecified abdominal pain: Secondary | ICD-10-CM

## 2022-01-12 DIAGNOSIS — Z87442 Personal history of urinary calculi: Secondary | ICD-10-CM | POA: Diagnosis not present

## 2022-01-12 DIAGNOSIS — R110 Nausea: Secondary | ICD-10-CM | POA: Diagnosis not present

## 2022-01-12 LAB — URINALYSIS, COMPLETE
Bilirubin, UA: NEGATIVE
Glucose, UA: NEGATIVE
Ketones, UA: NEGATIVE
Leukocytes,UA: NEGATIVE
Nitrite, UA: NEGATIVE
Protein,UA: NEGATIVE
RBC, UA: NEGATIVE
Specific Gravity, UA: 1.015 (ref 1.005–1.030)
Urobilinogen, Ur: 0.2 mg/dL (ref 0.2–1.0)
pH, UA: 7 (ref 5.0–7.5)

## 2022-01-12 LAB — MICROSCOPIC EXAMINATION
Bacteria, UA: NONE SEEN
Epithelial Cells (non renal): NONE SEEN /hpf (ref 0–10)
RBC, Urine: NONE SEEN /hpf (ref 0–2)
WBC, UA: NONE SEEN /hpf (ref 0–5)

## 2022-01-12 MED ORDER — NAPROXEN 500 MG PO TABS: 500.0000 mg | ORAL_TABLET | Freq: Two times a day (BID) | ORAL | Status: DC

## 2022-01-12 MED ORDER — ORPHENADRINE CITRATE ER 100 MG PO TB12: 100.0000 mg | ORAL_TABLET | Freq: Two times a day (BID) | ORAL | 0 refills | Status: DC

## 2022-01-12 NOTE — Progress Notes (Signed)
° °  Subjective: Left flank pain    Patient ID: Jacob Payne, male    DOB: 07-27-1991, 31 y.o.   MRN: [id number]  HPI Patient states flank pain for approximately 1 year.  Patient pain worsened last night seeing urologist secondary to his history of kidney stones.  Patient states urinalysis exam negative for kidney stone .  No radicular component with complaint.   Review of Systems Epigastric pain     Objective:   Physical Exam No acute distress.  Temperature is 98.6, pulse 80, respiration 14, BP is 131/89 and patient 90% O2 sat on room air.  Patient weighs 208 pounds BMI is 30.72. Abdomen is negative HSM, normoactive bowel sounds, soft, nontender to palpation. No obvious lumbar spine deformity.  No CVA guarding.  Patient has full and equal range of motion.  Patient negative straight leg test.       Assessment & Plan: Left flank pain.  Discussed with patient trial of muscle relaxer and anti-inflammatory medication for 5 days.  Follow-up in 5 days.  If no resolution will consider imaging.

## 2022-01-12 NOTE — Progress Notes (Signed)
Pt states left flank pain last night when laying down. Pt thought he was having a kidney stone and when to get checked at urology and he checked out fine. Pt states this has been an on going issue but the pain usually goes away this time its not.

## 2022-01-12 NOTE — Progress Notes (Signed)
01/12/2022 9:08 PM   [name] [birth year] [id number]  Referring provider: No referring provider defined for this encounter.  Chief Complaint  Patient presents with   Nephrolithiasis   Urological history: 1.  Nephrolithiasis -stone composition is 90% calcium oxalate dihydrate and 10% calcium oxalate monohydrate -ESWL followed by [facility] in 2021 for left sided stone -CT renal stone study 2021 - NED   HPI: Jacob Payne is a 31 y.o. male who presents today for left flank pain.  He has been diagnosed with hereditary hemochromatosis-heterozygous since his last visit with [place] and is followed by his PCP with yearly ferritin levels.  UA benign  KUB 4 mm radiopaque calcification at the level of L2 suspicious for a ureteral stone  He states that he has been experiencing left-sided flank pain off and on for the last several months, but last evening the pain ensued and would not relent.  It is a 6 out of 10 pain associated with nausea, urgency and dysuria.  Patient denies any modifying or aggravating factors.  Patient denies any gross hematuria or suprapubic.  Patient denies any fevers, chills, nausea or vomiting.    STAT CT Renal stone study today was negative.    PMH: Past Medical History:  Diagnosis Date   Elevated fasting glucose    Elevated LFTs    GERD (gastroesophageal reflux disease)    Overweight     Surgical History: Past Surgical History:  Procedure Laterality Date   APPENDECTOMY     CYSTOSCOPY W/ RETROGRADES Left 04/21/2020   Procedure: CYSTOSCOPY WITH RETROGRADE PYELOGRAM;  Surgeon: Abbie Sons, MD;  Location: [facility];  Service: Urology;  Laterality: Left;   CYSTOSCOPY WITH URETEROSCOPY, STONE BASKETRY AND STENT PLACEMENT Left 04/21/2020   Procedure: CYSTOSCOPY WITH URETEROSCOPY, STONE BASKETRY AND STENT PLACEMENT;  Surgeon: Abbie Sons, MD;  Location: [facility];  Service: Urology;  Laterality: Left;   ESOPHAGOGASTRODUODENOSCOPY (EGD) WITH  PROPOFOL N/A 08/31/2021   Procedure: ESOPHAGOGASTRODUODENOSCOPY (EGD) WITH PROPOFOL;  Surgeon: Virgel Manifold, MD;  Location: [facility];  Service: Endoscopy;  Laterality: N/A;   EXTRACORPOREAL SHOCK WAVE LITHOTRIPSY Left 04/16/2020   Procedure: EXTRACORPOREAL SHOCK WAVE LITHOTRIPSY (ESWL);  Surgeon: Abbie Sons, MD;  Location: [facility];  Service: Urology;  Laterality: Left;   POLYPECTOMY N/A 08/31/2021   Procedure: POLYPECTOMY;  Surgeon: Virgel Manifold, MD;  Location: [facility];  Service: Endoscopy;  Laterality: N/A;    Home Medications:  Allergies as of 01/12/2022   No Known Allergies      Medication List        Accurate as of January 12, 2022 11:59 PM. If you have any questions, ask your nurse or doctor.          STOP taking these medications    amoxicillin 875 MG tablet Commonly known as: AMOXIL Stopped by: Sunjai Levandoski, PA-C   brompheniramine-pseudoephedrine-DM 30-2-10 MG/5ML syrup Stopped by: Teondre Jarosz, PA-C   esomeprazole 40 MG capsule Commonly known as: NEXIUM Stopped by: Rhys Lichty, PA-C   lidocaine 2 % solution Commonly known as: XYLOCAINE Stopped by: Zara Council, PA-C       TAKE these medications    naproxen 500 MG tablet Commonly known as: Naprosyn Take 1 tablet (500 mg total) by mouth 2 (two) times daily with a meal. Started by: Sable Feil, PA-C   orphenadrine 100 MG tablet Commonly known as: NORFLEX Take 1 tablet (100 mg total) by mouth 2 (two) times daily. Started by:  Sable Feil, PA-C        Allergies: No Known Allergies  Family History: Family History  Problem Relation Age of Onset   Diabetes Father    Cancer Maternal Grandfather     Social History:  reports that he has never smoked. He has never used smokeless tobacco. He reports that he does not drink alcohol and does not use drugs.  ROS: Pertinent ROS in HPI  Physical Exam: BP (!) 132/92    Pulse 85    Ht 5\' 9"   (1.753 m)    Wt 206 lb (93.4 kg)    BMI 30.42 kg/m   Constitutional:  Well nourished. Alert and oriented, No acute distress. HEENT: [place] AT, mask in place.  Trachea midline Cardiovascular: No clubbing, cyanosis, or edema. Respiratory: Normal respiratory effort, no increased work of breathing. Neurologic: Grossly intact, no focal deficits, moving all 4 extremities. Psychiatric: Normal mood and affect.   Laboratory Data: Lab Results  Component Value Date   WBC 5.0 07/23/2021   HGB 15.8 07/23/2021   HCT 45.4 07/23/2021   MCV 89 07/23/2021   PLT 187 07/23/2021    Lab Results  Component Value Date   CREATININE 1.30 (H) 07/23/2021    Lab Results  Component Value Date   AST 30 07/23/2021   Lab Results  Component Value Date   ALT 29 07/23/2021    Urinalysis Results for orders placed or performed in visit on 01/12/22  CULTURE, URINE COMPREHENSIVE   Specimen: Urine   UR  Result Value Ref Range   Urine Culture, Comprehensive Final report    Organism ID, Bacteria Comment   Microscopic Examination   Urine  Result Value Ref Range   WBC, UA None seen 0 - 5 /hpf   RBC None seen 0 - 2 /hpf   Epithelial Cells (non renal) None seen 0 - 10 /hpf   Bacteria, UA None seen None seen/Few  Urinalysis, Complete  Result Value Ref Range   Specific Gravity, UA 1.015 1.005 - 1.030   pH, UA 7.0 5.0 - 7.5   Color, UA Yellow Yellow   Appearance Ur Clear Clear   Leukocytes,UA Negative Negative   Protein,UA Negative Negative/Trace   Glucose, UA Negative Negative   Ketones, UA Negative Negative   RBC, UA Negative Negative   Bilirubin, UA Negative Negative   Urobilinogen, Ur 0.2 0.2 - 1.0 mg/dL   Nitrite, UA Negative Negative   Microscopic Examination See below:   I have reviewed the labs.   Pertinent Imaging: CLINICAL DATA:  Left flank pain and nausea. Nephrolithiasis. Prior appendectomy.   EXAM: CT ABDOMEN AND PELVIS WITHOUT CONTRAST   TECHNIQUE: Multidetector CT imaging of the  abdomen and pelvis was performed following the standard protocol without IV contrast.   RADIATION DOSE REDUCTION: This exam was performed according to the departmental dose-optimization program which includes automated exposure control, adjustment of the mA and/or kV according to patient size and/or use of iterative reconstruction technique.   COMPARISON:  12/03/2020   FINDINGS: Lower chest: No acute findings.   Hepatobiliary: No mass visualized on this unenhanced exam. Gallbladder is unremarkable. No evidence of biliary ductal dilatation.   Pancreas: No mass or inflammatory process visualized on this unenhanced exam.   Spleen:  Within normal limits in size.   Adrenals/Urinary tract: No evidence of urolithiasis or hydronephrosis. Unremarkable unopacified urinary bladder.   Stomach/Bowel: No evidence of obstruction, inflammatory process, or abnormal fluid collections.   Vascular/Lymphatic: No pathologically enlarged lymph nodes  identified. No evidence of abdominal aortic aneurysm.   Reproductive:  No mass or other significant abnormality.   Other:  None.   Musculoskeletal:  No suspicious bone lesions identified.   IMPRESSION: Negative. No evidence of urolithiasis, hydronephrosis, or other acute findings.     Electronically Signed   By: Marlaine Hind M.D.   On: 01/12/2022 11:57  I have independently reviewed the films.  See HPI.   Assessment & Plan:    1. Left flank pain -Today's CT renal stone study was negative for hydronephrosis and nephrolithiasis -patient instructed to follow up with PCP for further evaluation of his left flank pain  Return if symptoms worsen or fail to improve.  These notes generated with voice recognition software. I apologize for typographical errors.  Zara Council, PA-C  [facility] [address]  [place], [place] [address] [phone]

## 2022-01-15 LAB — CULTURE, URINE COMPREHENSIVE

## 2022-01-20 ENCOUNTER — Ambulatory Visit: Payer: Self-pay | Admitting: Physician Assistant

## 2022-01-24 ENCOUNTER — Other Ambulatory Visit: Payer: Self-pay

## 2022-01-24 ENCOUNTER — Ambulatory Visit: Payer: Self-pay | Admitting: Physician Assistant

## 2022-01-24 ENCOUNTER — Encounter: Payer: Self-pay | Admitting: Physician Assistant

## 2022-01-24 VITALS — BP 128/75 | HR 107 | Temp 97.6°F | Resp 14 | Ht 69.0 in | Wt 210.0 lb

## 2022-01-24 DIAGNOSIS — R109 Unspecified abdominal pain: Secondary | ICD-10-CM

## 2022-01-24 DIAGNOSIS — G8929 Other chronic pain: Secondary | ICD-10-CM

## 2022-01-24 NOTE — Progress Notes (Signed)
Pt states its better then what it was but its still there.Jacob Payne

## 2022-01-24 NOTE — Progress Notes (Signed)
° °  Subjective: Chronic left flank pain    Patient ID: Jacob Payne, male    DOB: 1991/12/02, 31 y.o.   MRN: [id number]  HPI Patient is a follow-up for chronic left flank pain greater than 1 year.  Patient has been evaluated by urologist secondary to his history of kidney stones.  Images and labs were negative.  Patient had mild transient relief with muscle relaxant anti-inflammatory medications.  Rates pain as a 4/10.  Patient states pain is worse in the morning.  Denies radicular component to pain.  Denies bladder or bowel dysfunction.   Review of Systems History epigastric pain, kidney stones, and hemochromatosis    Objective:   Physical Exam  No acute distress.  Temperature 97.6, pulse 107, respiration 14, BP is 128/75, patient weighs 210 pounds, BMI is 31.01.  No obvious deformity to the lumbar spine.  No guarding palpation of the left flank at this time.  Patient has full and equal range of motion of the lumbar spine.      Assessment & Plan: Chronic left flank pain.  Patient is amenable to evaluation and treatment by physical therapy.

## 2022-01-25 NOTE — Addendum Note (Signed)
Addended by: Gardner Candle on: 01/25/2022 09:11 AM   Modules accepted: Orders

## 2022-03-09 IMAGING — CT CT RENAL STONE PROTOCOL
2 of 4 series · 16 of 46 positions shown, 18 images · non-contrast
Comparison: 12/03/2020

CLINICAL DATA: Left flank pain and nausea. Nephrolithiasis. Prior
appendectomy.



[Series 2: renal stone 5.00 · axial · 0.83mm/px · z∈[-1495,-1075]mm · 13 of 98 slices shown, 15 images]
[im 7/98  soft-tissue]
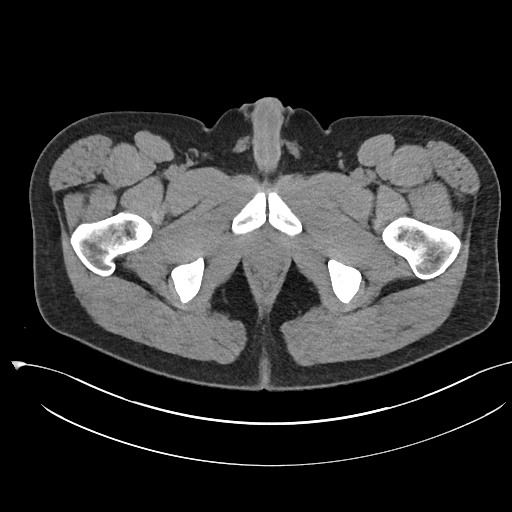
[im 7/98  bone]
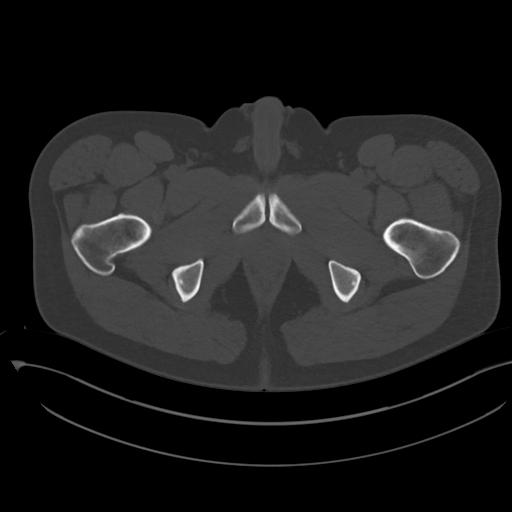
[im 13/98  soft-tissue]
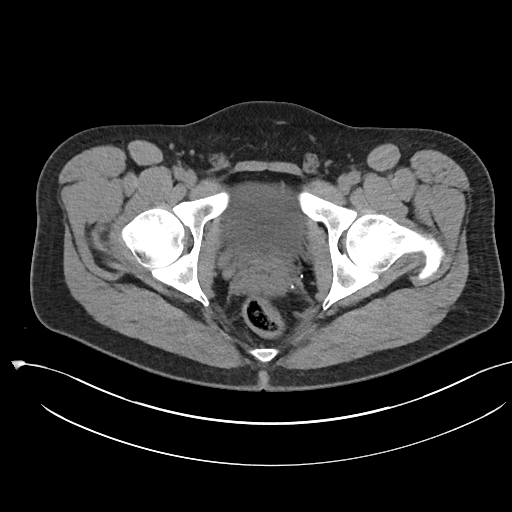
[im 20/98  soft-tissue]
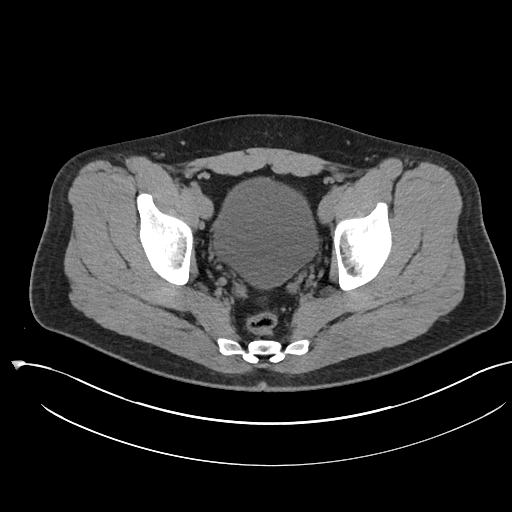
[im 26/98  soft-tissue]
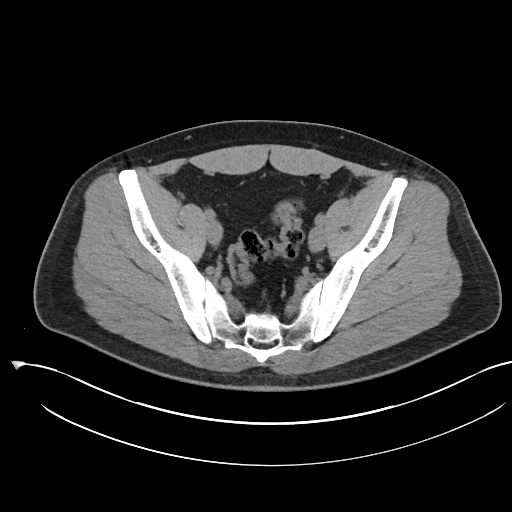
[im 33/98  soft-tissue]
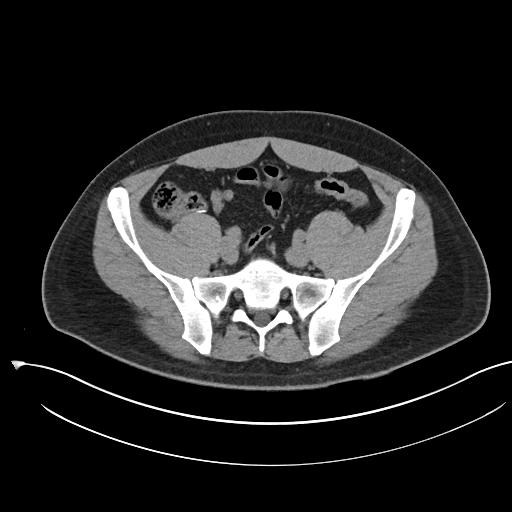
[im 39/98  soft-tissue]
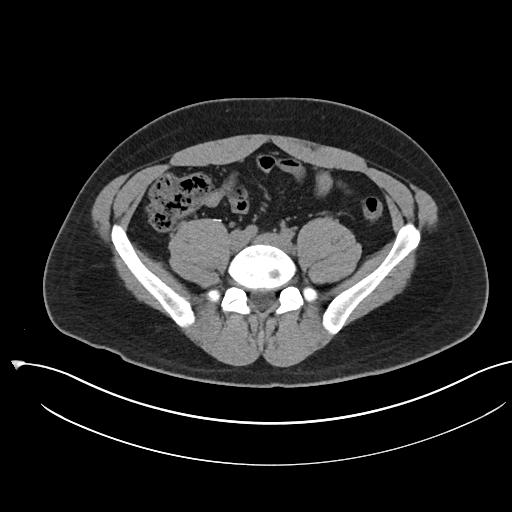
[im 52/98  soft-tissue]
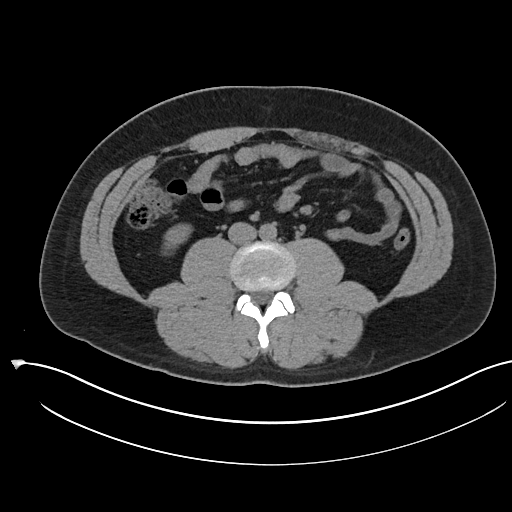
[im 59/98  soft-tissue]
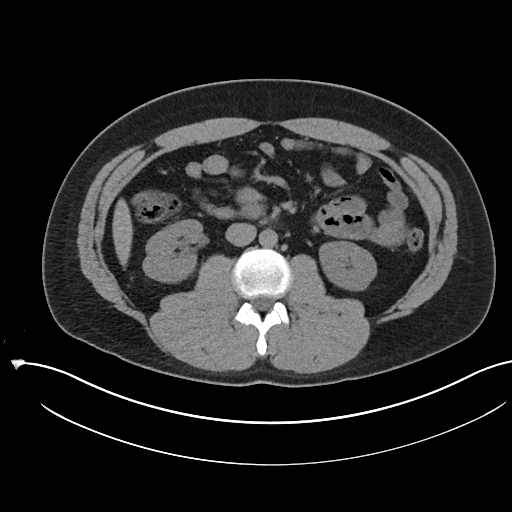
[im 65/98  soft-tissue]
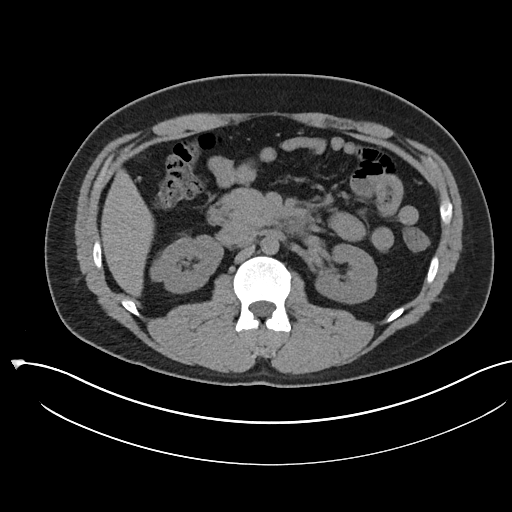
[im 65/98  bone]
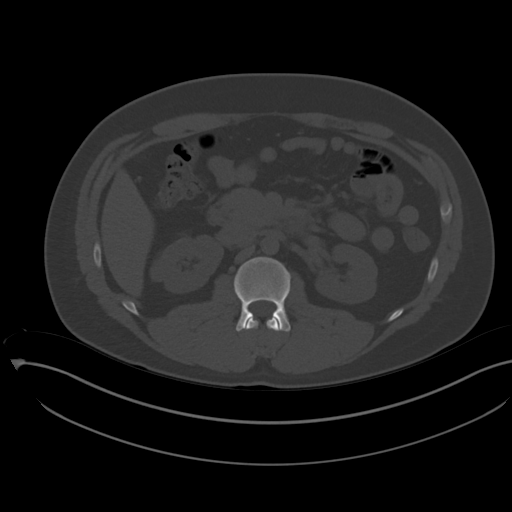
[im 72/98  soft-tissue]
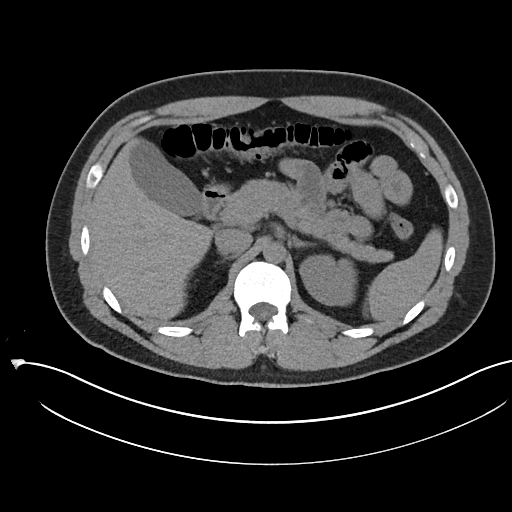
[im 78/98  soft-tissue]
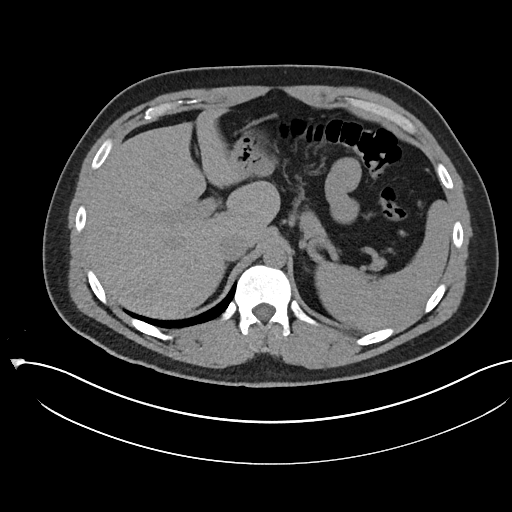
[im 85/98  soft-tissue]
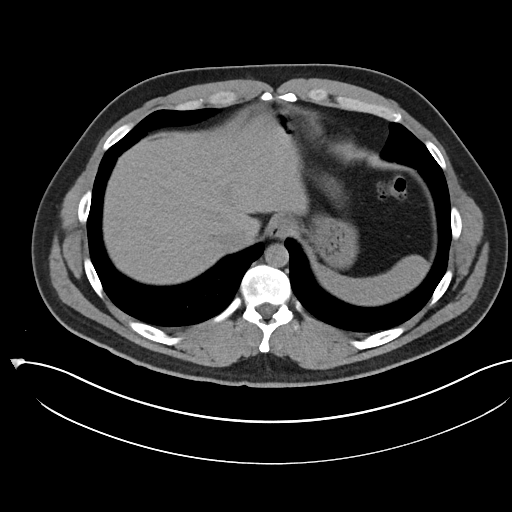
[im 91/98  soft-tissue]
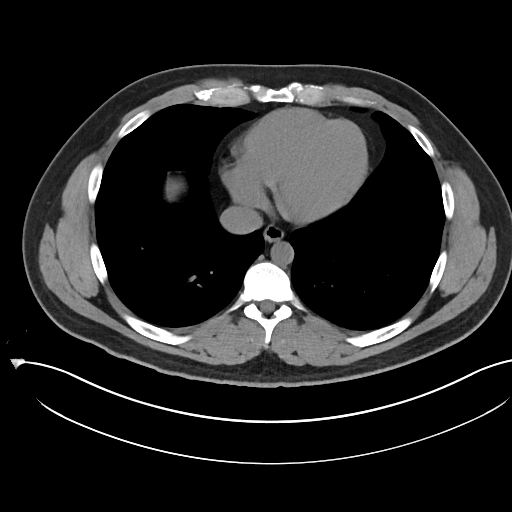

[Series 4: renal stone 2.00 cor · coronal · 0.83mm/px · 3 of 212 slices shown]
[im 71/212  soft-tissue]
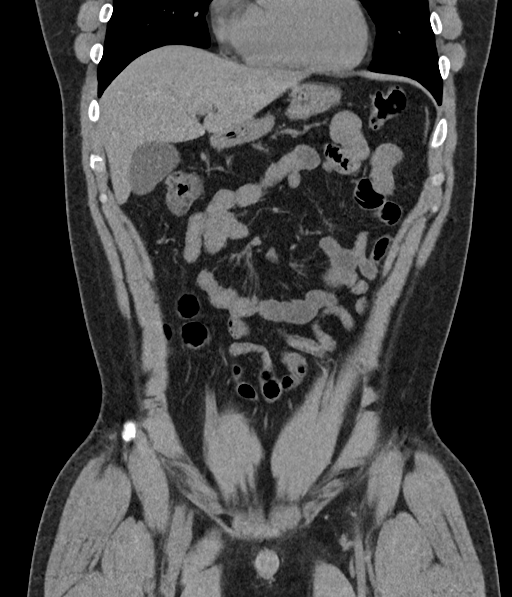
[im 94/212  soft-tissue]
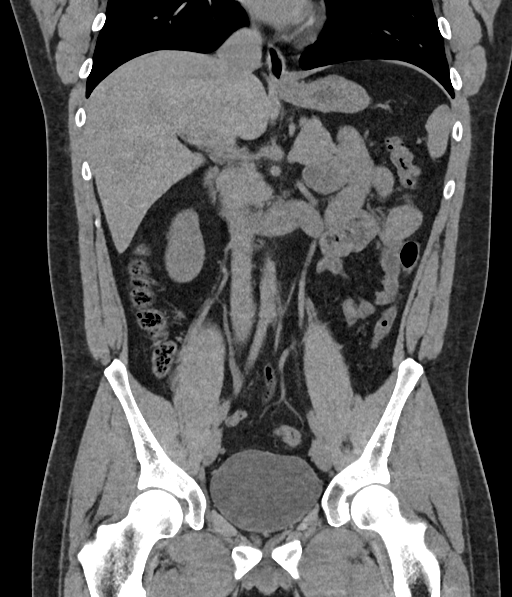
[im 118/212  soft-tissue]
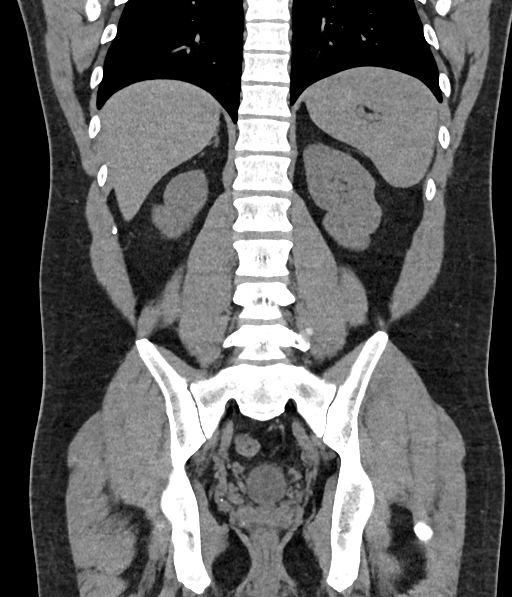

[16 of 46 positions shown; findings below may reference images not displayed]

FINDINGS: Lower chest: No acute findings.

Hepatobiliary: No mass visualized on this unenhanced exam.
Gallbladder is unremarkable. No evidence of biliary ductal
dilatation.

Pancreas: No mass or inflammatory process visualized on this
unenhanced exam.

Spleen:  Within normal limits in size.

Adrenals/Urinary tract: No evidence of urolithiasis or
hydronephrosis. Unremarkable unopacified urinary bladder.

Stomach/Bowel: No evidence of obstruction, inflammatory process, or
abnormal fluid collections.

Vascular/Lymphatic: No pathologically enlarged lymph nodes
identified. No evidence of abdominal aortic aneurysm.

Reproductive:  No mass or other significant abnormality.

Other:  None.

Musculoskeletal:  No suspicious bone lesions identified.
IMPRESSION: Negative. No evidence of urolithiasis, hydronephrosis, or other
acute findings.

## 2022-05-19 ENCOUNTER — Ambulatory Visit: Payer: Self-pay

## 2022-05-19 DIAGNOSIS — Z0000 Encounter for general adult medical examination without abnormal findings: Secondary | ICD-10-CM

## 2022-05-19 NOTE — Progress Notes (Signed)
Pt presents today for physical labs, will return to clinic for scheduled physical.  

## 2022-05-20 LAB — CMP12+LP+TP+TSH+6AC+CBC/D/PLT
ALT: 73 IU/L — ABNORMAL HIGH (ref 0–44)
AST: 35 IU/L (ref 0–40)
Albumin/Globulin Ratio: 2.2 (ref 1.2–2.2)
Albumin: 4.8 g/dL (ref 4.1–5.2)
Alkaline Phosphatase: 56 IU/L (ref 44–121)
BUN/Creatinine Ratio: 12 (ref 9–20)
BUN: 14 mg/dL (ref 6–20)
Basophils Absolute: 0 10*3/uL (ref 0.0–0.2)
Basos: 1 %
Bilirubin Total: 0.4 mg/dL (ref 0.0–1.2)
Calcium: 9.8 mg/dL (ref 8.7–10.2)
Chloride: 102 mmol/L (ref 96–106)
Chol/HDL Ratio: 4.6 ratio (ref 0.0–5.0)
Cholesterol, Total: 183 mg/dL (ref 100–199)
Creatinine, Ser: 1.17 mg/dL (ref 0.76–1.27)
EOS (ABSOLUTE): 0.1 10*3/uL (ref 0.0–0.4)
Eos: 2 %
Estimated CHD Risk: 0.9 times avg. (ref 0.0–1.0)
Free Thyroxine Index: 2.5 (ref 1.2–4.9)
GGT: 22 IU/L (ref 0–65)
Globulin, Total: 2.2 g/dL (ref 1.5–4.5)
Glucose: 108 mg/dL — ABNORMAL HIGH (ref 70–99)
HDL: 40 mg/dL (ref >39)
Hematocrit: 46.3 % (ref 37.5–51.0)
Hemoglobin: 16.3 g/dL (ref 13.0–17.7)
Immature Grans (Abs): 0 10*3/uL (ref 0.0–0.1)
Immature Granulocytes: 0 %
Iron: 114 ug/dL (ref 38–169)
LDH: 180 IU/L (ref 121–224)
LDL Chol Calc (NIH): 112 mg/dL — ABNORMAL HIGH (ref 0–99)
Lymphocytes Absolute: 1.9 10*3/uL (ref 0.7–3.1)
Lymphs: 31 %
MCH: 30.9 pg (ref 26.6–33.0)
MCHC: 35.2 g/dL (ref 31.5–35.7)
MCV: 88 fL (ref 79–97)
Monocytes Absolute: 0.5 10*3/uL (ref 0.1–0.9)
Monocytes: 7 %
Neutrophils Absolute: 3.6 10*3/uL (ref 1.4–7.0)
Neutrophils: 59 %
Phosphorus: 3.7 mg/dL (ref 2.8–4.1)
Platelets: 194 10*3/uL (ref 150–450)
Potassium: 4.9 mmol/L (ref 3.5–5.2)
RBC: 5.28 x10E6/uL (ref 4.14–5.80)
RDW: 12.2 % (ref 11.6–15.4)
Sodium: 141 mmol/L (ref 134–144)
T3 Uptake Ratio: 28 % (ref 24–39)
T4, Total: 9.1 ug/dL (ref 4.5–12.0)
TSH: 2.66 u[IU]/mL (ref 0.450–4.500)
Total Protein: 7 g/dL (ref 6.0–8.5)
Triglycerides: 179 mg/dL — ABNORMAL HIGH (ref 0–149)
Uric Acid: 6.3 mg/dL (ref 3.8–8.4)
VLDL Cholesterol Cal: 31 mg/dL (ref 5–40)
WBC: 6.1 10*3/uL (ref 3.4–10.8)
eGFR: 86 mL/min/{1.73_m2} (ref >59)

## 2022-05-24 ENCOUNTER — Encounter: Payer: Self-pay | Admitting: Physician Assistant

## 2022-05-24 ENCOUNTER — Ambulatory Visit: Payer: Self-pay | Admitting: Physician Assistant

## 2022-05-24 VITALS — BP 143/91 | HR 98 | Temp 97.6°F | Resp 14 | Ht 70.0 in | Wt 217.0 lb

## 2022-05-24 DIAGNOSIS — Z0000 Encounter for general adult medical examination without abnormal findings: Secondary | ICD-10-CM

## 2022-05-24 DIAGNOSIS — R030 Elevated blood-pressure reading, without diagnosis of hypertension: Secondary | ICD-10-CM

## 2022-05-24 LAB — POCT URINALYSIS DIPSTICK
Bilirubin, UA: NEGATIVE
Blood, UA: NEGATIVE
Glucose, UA: NEGATIVE
Ketones, UA: NEGATIVE
Leukocytes, UA: NEGATIVE
Nitrite, UA: NEGATIVE
Protein, UA: POSITIVE — AB
Spec Grav, UA: >=1.03 — AB (ref 1.010–1.025)
Urobilinogen, UA: 0.2 E.U./dL
pH, UA: 5.5 (ref 5.0–8.0)

## 2022-05-24 NOTE — Progress Notes (Signed)
Pt presents today to complete physical, Pt concerned of shoulder pain./CL,RMA

## 2022-05-24 NOTE — Progress Notes (Signed)
[facility] Emergency Department Provider Note  ____________________________________________   None    (approximate)  I have reviewed the triage vital signs and the nursing notes.   HISTORY  Chief Complaint Annual Exam   HPI Jacob Payne is a 31 y.o. male patient presents for annual physical exam.  First concern for minor left shoulder pain secondary to injury which occurred 6 months ago.  Denies loss sensation or function of the left nondominant upper extremity.          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   History of nephrolithiasis 11/12/2012    Past Surgical History:  Procedure Laterality Date   APPENDECTOMY     CYSTOSCOPY W/ RETROGRADES Left 04/21/2020   Procedure: CYSTOSCOPY WITH RETROGRADE PYELOGRAM;  Surgeon: Abbie Sons, MD;  Location: [facility];  Service: Urology;  Laterality: Left;   CYSTOSCOPY WITH URETEROSCOPY, STONE BASKETRY AND STENT PLACEMENT Left 04/21/2020   Procedure: CYSTOSCOPY WITH URETEROSCOPY, STONE BASKETRY AND STENT PLACEMENT;  Surgeon: Abbie Sons, MD;  Location: [facility];  Service: Urology;  Laterality: Left;   ESOPHAGOGASTRODUODENOSCOPY (EGD) WITH PROPOFOL N/A 08/31/2021   Procedure: ESOPHAGOGASTRODUODENOSCOPY (EGD) WITH PROPOFOL;  Surgeon: Virgel Manifold, MD;  Location: [place];  Service: Endoscopy;  Laterality: N/A;   EXTRACORPOREAL SHOCK WAVE LITHOTRIPSY Left 04/16/2020   Procedure: EXTRACORPOREAL SHOCK WAVE LITHOTRIPSY (ESWL);  Surgeon: Abbie Sons, MD;  Location: [facility];  Service: Urology;  Laterality: Left;   POLYPECTOMY N/A 08/31/2021   Procedure: POLYPECTOMY;  Surgeon: Virgel Manifold, MD;  Location: [place];  Service:  Endoscopy;  Laterality: N/A;    Prior to Admission medications   Not on File    Allergies Patient has no known allergies.  Family History  Problem Relation Age of Onset   Diabetes Father    Cancer Maternal Grandfather     Social History Social History   Tobacco Use   Smoking status: Never   Smokeless tobacco: Never  Substance Use Topics   Alcohol use: Never   Drug use: Never    Review of Systems {Constitutional: No fever/chills Eyes: No visual changes. ENT: No sore throat. Cardiovascular: Denies chest pain. Respiratory: Denies shortness of breath. Gastrointestinal: No abdominal pain.  No nausea, no vomiting.  No diarrhea.  No constipation.  GERD. Genitourinary: Negative for dysuria. Musculoskeletal: Negative for back pain. Skin: Negative for rash. Neurological: Negative for headaches, focal weakness or numbness.   ____________________________________________   PHYSICAL EXAM:  VITAL SIGNS: BP is 143/91, pulse 98, respiration 14, temperature 97.6, and patient 95% O2 sat on room air.  Patient weighs 217 pounds and BMI is 31.14. Constitutional: Alert and oriented. Well appearing and in no acute distress. Eyes: Conjunctivae are normal. PERRL. EOMI. Head: Atraumatic. Nose: No congestion/rhinnorhea. Mouth/Throat: Mucous membranes are moist.  Oropharynx non-erythematous. Neck: No stridor.  No cervical spine tenderness to palpation. Hematological/Lymphatic/Immunilogical: No cervical lymphadenopathy. Cardiovascular: Normal rate, regular rhythm. Grossly normal heart sounds.  Good peripheral circulation. Respiratory: Normal respiratory effort.  No retractions. Lungs CTAB. Gastrointestinal: Soft and nontender. No distention. No abdominal bruits. No CVA tenderness. Genitourinary: Deferred Musculoskeletal: No lower extremity tenderness nor edema.  No joint effusions. Neurologic:  Normal speech  and language. No gross focal neurologic deficits are appreciated. No gait  instability. Skin:  Skin is warm, dry and intact. No rash noted. Psychiatric: Mood and affect are normal. Speech and behavior are normal.  ____________________________________________   LABS           Component Ref Range & Units 5 d ago (05/19/22) 4 mo ago (01/12/22) 10 mo ago (07/23/21) 10 mo ago (07/23/21) 10 mo ago (07/12/21) 10 mo ago (07/06/21) 1 yr ago (05/24/21)  Glucose 70 - 99 mg/dL 108 High    104 High  R     109 High  R   Uric Acid 3.8 - 8.4 mg/dL 6.3       6.3 CM   Comment:            Therapeutic target for gout patients: <6.0  BUN 6 - 20 mg/dL '14   11     14   ' Creatinine, Ser 0.76 - 1.27 mg/dL 1.17   1.30 High      1.22   eGFR >59 mL/min/1.73 86   76     82   BUN/Creatinine Ratio 9 - '20 12   8 ' Low      11   Sodium 134 - 144 mmol/L 141   141     142   Potassium 3.5 - 5.2 mmol/L 4.9   4.6     4.4   Chloride 96 - 106 mmol/L 102   101     101   Calcium 8.7 - 10.2 mg/dL 9.8   10.0     9.8   Phosphorus 2.8 - 4.1 mg/dL 3.7       3.7   Total Protein 6.0 - 8.5 g/dL 7.0   7.4     7.9   Albumin 4.1 - 5.2 g/dL 4.8   5.2     5.2   Globulin, Total 1.5 - 4.5 g/dL 2.2   2.2     2.7   Albumin/Globulin Ratio 1.2 - 2.2 2.2   2.4 High      1.9   Bilirubin Total 0.0 - 1.2 mg/dL 0.4   0.3     0.3   Alkaline Phosphatase 44 - 121 IU/L 56   64     82   LDH 121 - 224 IU/L 180       177   AST 0 - 40 IU/L 35   30     26   ALT 0 - 44 IU/L 73 High    29     49 High    GGT 0 - 65 IU/L 22       20   Iron 38 - 169 ug/dL 114     119  123  134   Cholesterol, Total 100 - 199 mg/dL 183       180   Triglycerides 0 - 149 mg/dL 179 High        197 High    HDL >39 mg/dL 40       44   VLDL Cholesterol Cal 5 - 40 mg/dL 31       34   LDL Chol Calc (NIH) 0 - 99 mg/dL 112 High        102 High    Chol/HDL Ratio 0.0 - 5.0 ratio 4.6       4.1 CM   Comment:  T. Chol/HDL Ratio                                              Men  Women                                1/2 Avg.Risk  3.4     3.3                                    Avg.Risk  5.0    4.4                                 2X Avg.Risk  9.6    7.1                                 3X Avg.Risk 23.4   11.0   Estimated CHD Risk 0.0 - 1.0 times avg. 0.9       0.8 CM   Comment: The CHD Risk is based on the T. Chol/HDL ratio. Other  factors affect CHD Risk such as hypertension, smoking,  diabetes, severe obesity, and family history of  premature CHD.   TSH 0.450 - 4.500 uIU/mL 2.660       3.910   T4, Total 4.5 - 12.0 ug/dL 9.1       9.4   T3 Uptake Ratio 24 - 39 % 28       27   Free Thyroxine Index 1.2 - 4.9 2.5       2.5   WBC 3.4 - 10.8 x10E3/uL 6.1    5.0    CANCELED R, CM   RBC 4.14 - 5.80 x10E6/uL 5.28  None seen R   5.09    CANCELED R, CM   Hemoglobin 13.0 - 17.7 g/dL 16.3    15.8    CANCELED R, CM   Hematocrit 37.5 - 51.0 % 46.3    45.4    CANCELED R, CM   MCV 79 - 97 fL 88    89    CANCELED R, CM   MCH 26.6 - 33.0 pg 30.9    31.0    CANCELED R, CM   MCHC 31.5 - 35.7 g/dL 35.2    34.8    CANCELED R, CM   RDW 11.6 - 15.4 % 12.2    11.9    CANCELED R, CM   Platelets 150 - 450 x10E3/uL 194    187    CANCELED R, CM   Neutrophils Not Estab. % 59       CANCELED R, CM   Lymphs Not Estab. % 31       CANCELED R, CM   Monocytes Not Estab. % 7       CANCELED R, CM   Eos Not Estab. % 2       CANCELED R, CM   Basos Not Estab. % 1       CANCELED R, CM   Neutrophils Absolute 1.4 - 7.0 x10E3/uL 3.6       CANCELED R, CM   Lymphocytes Absolute 0.7 - 3.1 x10E3/uL 1.9  CANCELED R, CM   Monocytes Absolute 0.1 - 0.9 x10E3/uL 0.5       CANCELED R, CM   EOS (ABSOLUTE) 0.0 - 0.4 x10E3/uL 0.1       CANCELED R, CM   Basophils Absolute 0.0 - 0.2 x10E3/uL 0.0       CANCELED R, CM   Immature Granulocytes Not Estab. % 0       CANCELED R, CM   Immature Grans              ____________________________________________      ____________________________________________   INITIAL IMPRESSION / ASSESSMENT AND PLAN  As part of my  medical decision making, I reviewed the following data within the [place]      Discussed lab results with patient showing elevated triglycerides.  Patient amenable to a 53-monthtrial of diet and exercise.  Patient also had protein in his urine will follow-up in 1 month.  Recommended 3-day blood pressure check secondary to elevated readings today.  Advise over-the-counter Voltaren ointment for shoulder pain.        ____________________________________________   FINAL CLINICAL IMPRESSION Well exam  ED Discharge Orders     None        Note:  This document was prepared using Dragon voice recognition software and may include unintentional dictation errors.

## 2022-07-29 DIAGNOSIS — Z87442 Personal history of urinary calculi: Secondary | ICD-10-CM | POA: Diagnosis not present

## 2022-07-29 DIAGNOSIS — R109 Unspecified abdominal pain: Secondary | ICD-10-CM | POA: Diagnosis not present

## 2022-07-30 DIAGNOSIS — N200 Calculus of kidney: Secondary | ICD-10-CM | POA: Diagnosis not present

## 2022-08-01 ENCOUNTER — Ambulatory Visit (INDEPENDENT_AMBULATORY_CARE_PROVIDER_SITE_OTHER): Payer: 59 | Admitting: Urology

## 2022-08-01 ENCOUNTER — Encounter: Payer: Self-pay | Admitting: Urology

## 2022-08-01 ENCOUNTER — Ambulatory Visit
Admission: RE | Admit: 2022-08-01 | Discharge: 2022-08-01 | Disposition: A | Discharge status: Home / Self Care | Payer: 59 | Attending: Urology | Admitting: Urology

## 2022-08-01 ENCOUNTER — Ambulatory Visit
Admission: RE | Admit: 2022-08-01 | Discharge: 2022-08-01 | Disposition: A | Discharge status: Home / Self Care | Payer: 59 | Source: Ambulatory Visit | Attending: Urology | Admitting: Urology

## 2022-08-01 ENCOUNTER — Other Ambulatory Visit: Payer: Self-pay

## 2022-08-01 ENCOUNTER — Other Ambulatory Visit
Admission: RE | Admit: 2022-08-01 | Discharge: 2022-08-01 | Disposition: A | Discharge status: Home / Self Care | Payer: 59 | Source: Home / Self Care | Attending: Urology | Admitting: Urology

## 2022-08-01 VITALS — BP 131/77 | HR 82 | Ht 70.0 in | Wt 219.0 lb

## 2022-08-01 DIAGNOSIS — N200 Calculus of kidney: Secondary | ICD-10-CM

## 2022-08-01 DIAGNOSIS — R109 Unspecified abdominal pain: Secondary | ICD-10-CM

## 2022-08-01 DIAGNOSIS — K5900 Constipation, unspecified: Secondary | ICD-10-CM | POA: Diagnosis not present

## 2022-08-01 LAB — URINALYSIS, COMPLETE (UACMP) WITH MICROSCOPIC
Bacteria, UA: NONE SEEN
Bilirubin Urine: NEGATIVE
Glucose, UA: NEGATIVE mg/dL
Hgb urine dipstick: NEGATIVE
Ketones, ur: NEGATIVE mg/dL
Leukocytes,Ua: NEGATIVE
Nitrite: NEGATIVE
Protein, ur: NEGATIVE mg/dL
RBC / HPF: NONE SEEN RBC/hpf (ref 0–5)
Specific Gravity, Urine: >1.03 — ABNORMAL HIGH (ref 1.005–1.030)
Squamous Epithelial / HPF: NONE SEEN (ref 0–5)
pH: 5.5 (ref 5.0–8.0)

## 2022-08-01 MED ORDER — KETOROLAC TROMETHAMINE 10 MG PO TABS: 10.0000 mg | ORAL_TABLET | Freq: Four times a day (QID) | ORAL | 0 refills | Status: DC | PRN

## 2022-08-01 MED ORDER — TAMSULOSIN HCL 0.4 MG PO CAPS: 0.4000 mg | ORAL_CAPSULE | Freq: Every day | ORAL | 0 refills | Status: DC

## 2022-08-01 NOTE — Progress Notes (Signed)
08/01/22 3:27 PM   Jacob Payne 12/11/1991 [id number]  Referring provider:  No referring provider defined for this encounter.  Urological history  1.  Nephrolithiasis -stone composition is 90% calcium oxalate dihydrate and 10% calcium oxalate monohydrate -ESWL followed by URS in 2021 for left sided stone -CT renal stone study 2021 - NED -CT renal stone study 12/2021 was negative  -KUB in 12/2021 was negative    Chief Complaint  Patient presents with   Nephrolithiasis     HPI: Jacob Payne is a 31 y.o.male who presents today for possible kidney stone.   He had sudden onset left side started a week ago it was intermittent and gotten progressively worse. It did not radiate. He denies nausea, fever, chills, and he denied constipation or diarrhea   UA clear, but he has been on ciprofloxacin since Friday that was prescribed through virtual visit   KUB today visualized a 5 mm calcification at the sacral iliac joint region. It also appears ot be present on several KUB's, so it is not likely a stone.    PMH: Past Medical History:  Diagnosis Date   Elevated fasting glucose    Elevated LFTs    GERD (gastroesophageal reflux disease)    Overweight     Surgical History: Past Surgical History:  Procedure Laterality Date   APPENDECTOMY     CYSTOSCOPY W/ RETROGRADES Left 04/21/2020   Procedure: CYSTOSCOPY WITH RETROGRADE PYELOGRAM;  Surgeon: Riki Altes, MD;  Location: [facility];  Service: Urology;  Laterality: Left;   CYSTOSCOPY WITH URETEROSCOPY, STONE BASKETRY AND STENT PLACEMENT Left 04/21/2020   Procedure: CYSTOSCOPY WITH URETEROSCOPY, STONE BASKETRY AND STENT PLACEMENT;  Surgeon: Riki Altes, MD;  Location: [facility];  Service: Urology;  Laterality: Left;   ESOPHAGOGASTRODUODENOSCOPY (EGD) WITH PROPOFOL N/A 08/31/2021   Procedure: ESOPHAGOGASTRODUODENOSCOPY (EGD) WITH PROPOFOL;  Surgeon: Pasty Spillers, MD;  Location: [facility];  Service:  Endoscopy;  Laterality: N/A;   EXTRACORPOREAL SHOCK WAVE LITHOTRIPSY Left 04/16/2020   Procedure: EXTRACORPOREAL SHOCK WAVE LITHOTRIPSY (ESWL);  Surgeon: Riki Altes, MD;  Location: [facility];  Service: Urology;  Laterality: Left;   POLYPECTOMY N/A 08/31/2021   Procedure: POLYPECTOMY;  Surgeon: Pasty Spillers, MD;  Location: [facility];  Service: Endoscopy;  Laterality: N/A;    Home Medications:  Allergies as of 08/01/2022   No Known Allergies      Medication List        Accurate as of August 01, 2022  3:27 PM. If you have any questions, ask your nurse or doctor.          ciprofloxacin 500 MG tablet Commonly known as: CIPRO SMARTSIG:1 Tablet(s) By Mouth Every 12 Hours   ketorolac 10 MG tablet Commonly known as: TORADOL Take 1 tablet (10 mg total) by mouth every 6 (six) hours as needed. Started by: Michiel Cowboy, PA-C   tamsulosin 0.4 MG Caps capsule Commonly known as: FLOMAX Take 1 capsule (0.4 mg total) by mouth daily. Started by: Michiel Cowboy, PA-C        Allergies:  No Known Allergies  Family History: Family History  Problem Relation Age of Onset   Diabetes Father    Cancer Maternal Grandfather     Social History:  reports that he has never smoked. He has never used smokeless tobacco. He reports that he does not drink alcohol and does not use drugs.   Physical Exam: BP 131/77   Pulse 82   Ht 5\' 10"  (1.778  m)   Wt 219 lb (99.3 kg)   BMI 31.42 kg/m   Constitutional:  Alert and oriented, No acute distress. HEENT: [place] AT, moist mucus membranes.  Trachea midline Cardiovascular: No clubbing, cyanosis, or edema. Respiratory: Normal respiratory effort, no increased work of breathing. Neurologic: Grossly intact, no focal deficits, moving all 4 extremities. Psychiatric: Normal mood and affect.  Laboratory Data:    Latest Ref Rng & Units 05/19/2022   10:06 AM 07/23/2021    1:29 PM 05/24/2021    9:25 AM  CMP  Glucose 70 - 99 mg/dL 657   846  962   BUN 6 - 20 mg/dL 14  11  14    Creatinine 0.76 - 1.27 mg/dL  9.52  8.41   Sodium 134 - 144 mmol/L 141  141  142   Potassium 3.5 - 5.2 mmol/L 4.9  4.6  4.4   Chloride 96 - 106 mmol/L 102  101  101   CO2 20 - 29 mmol/L  24    Calcium 8.7 - 10.2 mg/dL 9.8  3.24  9.8   Total Protein 6.0 - 8.5 g/dL 7.0  7.4  7.9   Total Bilirubin 0.0 - 1.2 mg/dL 0.4  0.3  0.3   Alkaline Phos 44 - 121 IU/L 56  64  82   AST 0 - 40 IU/L 35  30  26   ALT 0 - 44 IU/L 73  29  49       Latest Ref Rng & Units 05/19/2022   10:06 AM 07/23/2021    1:29 PM 05/24/2021    9:25 AM  CBC  WBC 3.4 - 10.8 x10E3/uL 6.1  5.0  CANCELED   Hemoglobin 13.0 - 17.7 g/dL 07/24/2021  02.7  CANCELED   Hematocrit 37.5 - 51.0 % 46.3  45.4  CANCELED   Platelets 150 - 450 x10E3/uL 194  187  CANCELED    Urinalysis Results for orders placed or performed during the hospital encounter of 08/01/22  Urinalysis, Complete w Microscopic  Result Value Ref Range   Color, Urine YELLOW YELLOW   APPearance CLEAR CLEAR   Specific Gravity, Urine >1.030 (H) 1.005 - 1.030   pH 5.5 5.0 - 8.0   Glucose, UA NEGATIVE NEGATIVE mg/dL   Hgb urine dipstick NEGATIVE NEGATIVE   Bilirubin Urine NEGATIVE NEGATIVE   Ketones, ur NEGATIVE NEGATIVE mg/dL   Protein, ur NEGATIVE NEGATIVE mg/dL   Nitrite NEGATIVE NEGATIVE   Leukocytes,Ua NEGATIVE NEGATIVE   Squamous Epithelial / LPF NONE SEEN 0 - 5   WBC, UA 0-5 0 - 5 WBC/hpf   RBC / HPF NONE SEEN 0 - 5 RBC/hpf   Bacteria, UA NONE SEEN NONE SEEN  I have reviewed the labs.    Pertinent Imaging: KUB with no distinct left sided radiopaque calcification no right sided radiopaque calcifications I have independently reviewed the films.  See HPI.  Radiologist interpretation still pending.   Assessment & Plan:    1. Left flank pain - Unclear etiology at this time as UA is clear and KUB is unrevealing  - Discussed pursuing RUS or repeating CT renal stone study for further evaluation - He would like to  undergo RUS to evaluate for hydronephrosis - discussed that pending RUS results would like to check another UA in one month to rule out micro heme and/or rUTI's   Return in about 1 week (around 08/08/2022) for RUS report .  [facility] [address] [place], [place]  [address] [phone]) [phone]  I, Kailey Littlejohn,acting as a scribe for [facility], PA-C.,have documented all relevant documentation on the behalf of Abella Shugart, PA-C,as directed by  [facility], PA-C while in the presence of Johnathyn Viscomi, PA-C.  I have reviewed the above documentation for accuracy and completeness, and I agree with the above.    Michiel Cowboy, PA-C

## 2022-08-08 NOTE — Progress Notes (Deleted)
08/08/22 10:19 AM   Jacob Payne Dec 02, 1991 [id number]  Referring provider:  No referring provider defined for this encounter.  Urological history  1.  Nephrolithiasis -stone composition is 90% calcium oxalate dihydrate and 10% calcium oxalate monohydrate -ESWL followed by URS in 2021 for left sided stone -CT renal stone study 2021 - NED -CT renal stone study 12/2021 was negative  -KUB in 12/2021 was negative  -KUB in 07/2022 constipation, no stones seen   No chief complaint on file.   HPI: Jacob Payne is a 31 y.o.male who presents for RUS report and repeat UA.    PMH: Past Medical History:  Diagnosis Date   Elevated fasting glucose    Elevated LFTs    GERD (gastroesophageal reflux disease)    Overweight     Surgical History: Past Surgical History:  Procedure Laterality Date   APPENDECTOMY     CYSTOSCOPY W/ RETROGRADES Left 04/21/2020   Procedure: CYSTOSCOPY WITH RETROGRADE PYELOGRAM;  Surgeon: Riki Altes, MD;  Location: [facility];  Service: Urology;  Laterality: Left;   CYSTOSCOPY WITH URETEROSCOPY, STONE BASKETRY AND STENT PLACEMENT Left 04/21/2020   Procedure: CYSTOSCOPY WITH URETEROSCOPY, STONE BASKETRY AND STENT PLACEMENT;  Surgeon: Riki Altes, MD;  Location: [facility];  Service: Urology;  Laterality: Left;   ESOPHAGOGASTRODUODENOSCOPY (EGD) WITH PROPOFOL N/A 08/31/2021   Procedure: ESOPHAGOGASTRODUODENOSCOPY (EGD) WITH PROPOFOL;  Surgeon: Pasty Spillers, MD;  Location[facility];  Service: Endoscopy;  Laterality: N/A;   EXTRACORPOREAL SHOCK WAVE LITHOTRIPSY Left 04/16/2020   Procedure: EXTRACORPOREAL SHOCK WAVE LITHOTRIPSY (ESWL);  Surgeon: Riki Altes, MD;  Location: [facility];  Service: Urology;  Laterality: Left;   POLYPECTOMY N/A 08/31/2021   Procedure: POLYPECTOMY;  Surgeon: Pasty Spillers, MD;  Location: [facility];  Service: Endoscopy;  Laterality: N/A;    Home Medications:  Allergies as of 08/09/2022    No Known Allergies      Medication List        Accurate as of August 08, 2022 10:19 AM. If you have any questions, ask your nurse or doctor.          ciprofloxacin 500 MG tablet Commonly known as: CIPRO SMARTSIG:1 Tablet(s) By Mouth Every 12 Hours   ketorolac 10 MG tablet Commonly known as: TORADOL Take 1 tablet (10 mg total) by mouth every 6 (six) hours as needed.   tamsulosin 0.4 MG Caps capsule Commonly known as: FLOMAX Take 1 capsule (0.4 mg total) by mouth daily.        Allergies:  No Known Allergies  Family History: Family History  Problem Relation Age of Onset   Diabetes Father    Cancer Maternal Grandfather     Social History:  reports that he has never smoked. He has never used smokeless tobacco. He reports that he does not drink alcohol and does not use drugs.   Physical Exam: There were no vitals taken for this visit.  Constitutional:  Well nourished. Alert and oriented, No acute distress. HEENT: [place] AT, moist mucus membranes.  Trachea midline Cardiovascular: No clubbing, cyanosis, or edema. Respiratory: Normal respiratory effort, no increased work of breathing. GU: No CVA tenderness.  No bladder fullness or masses.  Patient with circumcised/uncircumcised phallus. ***Foreskin easily retracted***  Urethral meatus is patent.  No penile discharge. No penile lesions or rashes. Scrotum without lesions, cysts, rashes and/or edema.  Testicles are located scrotally bilaterally. No masses are appreciated in the testicles. Left and right epididymis are normal. Rectal: Patient with  normal sphincter tone. Anus and perineum without scarring or rashes. No rectal masses are appreciated. Prostate is approximately *** grams, *** nodules are appreciated. Seminal vesicles are normal. Neurologic: Grossly intact, no focal deficits, moving all 4 extremities. Psychiatric: Normal mood and affect.   Laboratory Data: Urinalysis No results found for any visits on  08/09/22. I have reviewed the labs.   Pertinent Imaging: ***  Assessment & Plan:    1. Left flank pain - last week's KUB demonstrated constipation, no stones - discussed undergoing RUS for further evaluation - results  No follow-ups on file.  [facility] [address], [address] [place], [place] [address] [phone]

## 2022-08-09 ENCOUNTER — Ambulatory Visit: Payer: 59 | Admitting: Urology

## 2022-08-09 DIAGNOSIS — R109 Unspecified abdominal pain: Secondary | ICD-10-CM

## 2022-08-17 ENCOUNTER — Ambulatory Visit
Admission: RE | Admit: 2022-08-17 | Discharge: 2022-08-17 | Disposition: A | Discharge status: Home / Self Care | Payer: 59 | Source: Ambulatory Visit | Attending: Urology | Admitting: Urology

## 2022-08-17 DIAGNOSIS — R109 Unspecified abdominal pain: Secondary | ICD-10-CM | POA: Diagnosis not present

## 2022-08-18 NOTE — Progress Notes (Unsigned)
08/20/22 9:15 AM   Jacob Payne 19-Aug-1991 [id number]  Referring provider:  No referring provider defined for this encounter.  Urological history  1.  Nephrolithiasis -stone composition is 90% calcium oxalate dihydrate and 10% calcium oxalate monohydrate -ESWL followed by [facility] in 2021 for left sided stone -CT renal stone study 2021 - NED -CT renal stone study 12/2021 was negative  -KUB in 12/2021 was negative  -KUB in 07/2022 constipation, no stones seen -RUS (07/2022) no hydro, no stones   Chief Complaint  Patient presents with   Follow-up    Discuss renal ultrasound    HPI: Jacob Payne is a 31 y.o.male who presents for RUS report and repeat UA.   RUS (07/2022) did not demonstrate any hydronephrosis or calculi.  PVR was 24 cc.  UA benign  Today, he states he still continues to have intermittent left-sided flank pain.  He states yesterday the pain was very intense.  He also mentioned that during the renal ultrasound he was experiencing intense right-sided flank pain that was relieved with urination.  He continues to experience frequency, mild dysuria and difficulty urinating.  He also has intermittent pain at the tip of his penis.  He feels that all the symptoms are very similar to previous stone attacks he has had in the past.  PMH: Past Medical History:  Diagnosis Date   Elevated fasting glucose    Elevated LFTs    GERD (gastroesophageal reflux disease)    Overweight     Surgical History: Past Surgical History:  Procedure Laterality Date   APPENDECTOMY     CYSTOSCOPY W/ RETROGRADES Left 04/21/2020   Procedure: CYSTOSCOPY WITH RETROGRADE PYELOGRAM;  Surgeon: Riki Altes, MD;  Location: [facility];  Service: Urology;  Laterality: Left;   CYSTOSCOPY WITH URETEROSCOPY, STONE BASKETRY AND STENT PLACEMENT Left 04/21/2020   Procedure: CYSTOSCOPY WITH URETEROSCOPY, STONE BASKETRY AND STENT PLACEMENT;  Surgeon: Riki Altes, MD;  Location: [facility];  Service:  Urology;  Laterality: Left;   ESOPHAGOGASTRODUODENOSCOPY (EGD) WITH PROPOFOL N/A 08/31/2021   Procedure: ESOPHAGOGASTRODUODENOSCOPY (EGD) WITH PROPOFOL;  Surgeon: Pasty Spillers, MD;  Location: [facility];  Service: Endoscopy;  Laterality: N/A;   EXTRACORPOREAL SHOCK WAVE LITHOTRIPSY Left 04/16/2020   Procedure: EXTRACORPOREAL SHOCK WAVE LITHOTRIPSY (ESWL);  Surgeon: Riki Altes, MD;  Location: [facility];  Service: Urology;  Laterality: Left;   POLYPECTOMY N/A 08/31/2021   Procedure: POLYPECTOMY;  Surgeon: Pasty Spillers, MD;  Location: [facility];  Service: Endoscopy;  Laterality: N/A;    Home Medications:  Allergies as of 08/19/2022   No Known Allergies      Medication List        Accurate as of August 19, 2022 11:59 PM. If you have any questions, ask your nurse or doctor.          STOP taking these medications    ciprofloxacin 500 MG tablet Commonly known as: CIPRO Stopped by: Aleicia Kenagy, PA-C       TAKE these medications    ketorolac 10 MG tablet Commonly known as: TORADOL Take 1 tablet (10 mg total) by mouth every 6 (six) hours as needed.   tamsulosin 0.4 MG Caps capsule Commonly known as: FLOMAX Take 1 capsule (0.4 mg total) by mouth daily.        Allergies:  No Known Allergies  Family History: Family History  Problem Relation Age of Onset   Diabetes Father    Cancer Maternal Grandfather     Social  History:  reports that he has never smoked. He has never been exposed to tobacco smoke. He has never used smokeless tobacco. He reports that he does not drink alcohol and does not use drugs.   Physical Exam: BP 110/79   Pulse 81   Ht 5\' 10"  (1.778 m)   Wt 210 lb (95.3 kg)   BMI 30.13 kg/m   Constitutional:  Well nourished. Alert and oriented, No acute distress. HEENT: [place] AT, moist mucus membranes.  Trachea midline Cardiovascular: No clubbing, cyanosis, or edema. Respiratory: Normal respiratory effort, no  increased work of breathing. Neurologic: Grossly intact, no focal deficits, moving all 4 extremities. Psychiatric: Normal mood and affect.   Laboratory Data: Urinalysis Results for orders placed or performed in visit on 08/19/22  Microscopic Examination   Urine  Result Value Ref Range   WBC, UA 0-5 0 - 5 /hpf   RBC, Urine 0-2 0 - 2 /hpf   Epithelial Cells (non renal) 0-10 0 - 10 /hpf   Bacteria, UA None seen None seen/Few  Urinalysis, Complete  Result Value Ref Range   Specific Gravity, UA 1.025 1.005 - 1.030   pH, UA 5.0 5.0 - 7.5   Color, UA Yellow Yellow   Appearance Ur Clear Clear   Leukocytes,UA Negative Negative   Protein,UA Negative Negative/Trace   Glucose, UA Negative Negative   Ketones, UA Negative Negative   RBC, UA Negative Negative   Bilirubin, UA Negative Negative   Urobilinogen, Ur 0.2 0.2 - 1.0 mg/dL   Nitrite, UA Negative Negative   Microscopic Examination See below:    I have reviewed the labs.   Pertinent Imaging: CLINICAL DATA:  Acute left flank pain for 3 weeks   EXAM: RENAL / URINARY TRACT ULTRASOUND COMPLETE   COMPARISON:  None Available.   FINDINGS: Right Kidney:   Renal measurements: 10.5 x 5.1 x 6.0 cm = volume: 169 mL. Echogenicity within normal limits. No mass or hydronephrosis visualized.   Left Kidney:   Renal measurements: 11.4 x 5.2 x 5.3 cm = volume: 170 mL. Echogenicity within normal limits. No mass or hydronephrosis visualized.   Bladder:   There is a 24 cc postvoid residual. The bladder is otherwise normal in appearance.   Other:   None.   IMPRESSION: 1. Mildly prominent bilateral renal pelvises without caliectasis. No hydronephrosis. No other abnormalities.     Electronically Signed   By: 10/19/22 III M.D.   On: 08/18/2022 08:08  I have independently reviewed the films.  See HPI.    Assessment & Plan:    1. Left flank pain - RUS (07/2022) w/o hydro or stones - KUB (07/2022)   constipation -Intermittent left-sided flank pain associated with urinary symptoms with negative urinalyses, urine cultures and imaging studies -Urine was sent for atypicals -Clinical picture is not straightforward there I will speak with the other providers in my office to gather the opinions on how to move forward  2. Right flank pain -He has bladder had over 600 cc in it at the time of renal ultrasound-the right-sided flank pain was relieved after urination, the right-sided flank pain is likely due to the overdistention of the bladder as he was instructed to drink copious amount of fluids before the exam  Return for I will contact the patient after we develop a treatment plan.  08/2022   [facility] [address], [place] [id number]

## 2022-08-19 ENCOUNTER — Ambulatory Visit (INDEPENDENT_AMBULATORY_CARE_PROVIDER_SITE_OTHER): Payer: 59 | Admitting: Urology

## 2022-08-19 ENCOUNTER — Encounter: Payer: Self-pay | Admitting: Urology

## 2022-08-19 VITALS — BP 110/79 | HR 81 | Ht 70.0 in | Wt 210.0 lb

## 2022-08-19 DIAGNOSIS — R109 Unspecified abdominal pain: Secondary | ICD-10-CM | POA: Diagnosis not present

## 2022-08-19 DIAGNOSIS — R300 Dysuria: Secondary | ICD-10-CM

## 2022-08-19 LAB — URINALYSIS, COMPLETE
Bilirubin, UA: NEGATIVE
Glucose, UA: NEGATIVE
Ketones, UA: NEGATIVE
Leukocytes,UA: NEGATIVE
Nitrite, UA: NEGATIVE
Protein,UA: NEGATIVE
RBC, UA: NEGATIVE
Specific Gravity, UA: 1.025 (ref 1.005–1.030)
Urobilinogen, Ur: 0.2 mg/dL (ref 0.2–1.0)
pH, UA: 5 (ref 5.0–7.5)

## 2022-08-19 LAB — MICROSCOPIC EXAMINATION: Bacteria, UA: NONE SEEN

## 2022-08-22 ENCOUNTER — Other Ambulatory Visit: Payer: Self-pay | Admitting: Urology

## 2022-08-22 DIAGNOSIS — N342 Other urethritis: Secondary | ICD-10-CM

## 2022-08-22 MED ORDER — DOXYCYCLINE HYCLATE 100 MG PO CAPS: 100.0000 mg | ORAL_CAPSULE | Freq: Two times a day (BID) | ORAL | 0 refills | Status: DC

## 2022-08-23 ENCOUNTER — Other Ambulatory Visit: Payer: Self-pay | Admitting: Urology

## 2022-08-23 DIAGNOSIS — R109 Unspecified abdominal pain: Secondary | ICD-10-CM

## 2022-08-25 ENCOUNTER — Telehealth: Payer: Self-pay | Admitting: Urology

## 2022-08-25 NOTE — Telephone Encounter (Signed)
Sent pt mychart msg, see separate encounter. 

## 2022-08-25 NOTE — Telephone Encounter (Signed)
Will you let Jacob Payne know that since his urine culture was positive for infection, we need to re evaluate him after he finishes his antibiotic.  If his symptoms abate after he completes his antibiotics, we need to recheck his ua and atypical culture to make sure we have cleared the infection.  If his symptoms persist despite the antibiotic, please let me know so that we can decide on next steps.

## 2022-08-26 LAB — MYCOPLASMA / UREAPLASMA CULTURE
Mycoplasma hominis Culture: NEGATIVE
Ureaplasma urealyticum: POSITIVE — AB

## 2022-09-05 ENCOUNTER — Other Ambulatory Visit: Payer: Self-pay

## 2022-09-05 DIAGNOSIS — N342 Other urethritis: Secondary | ICD-10-CM

## 2022-09-06 ENCOUNTER — Other Ambulatory Visit: Payer: 59

## 2022-09-06 DIAGNOSIS — N342 Other urethritis: Secondary | ICD-10-CM | POA: Diagnosis not present

## 2022-09-06 LAB — URINALYSIS, COMPLETE
Bilirubin, UA: NEGATIVE
Glucose, UA: NEGATIVE
Ketones, UA: NEGATIVE
Leukocytes,UA: NEGATIVE
Nitrite, UA: NEGATIVE
Protein,UA: NEGATIVE
RBC, UA: NEGATIVE
Specific Gravity, UA: 1.025 (ref 1.005–1.030)
Urobilinogen, Ur: 0.2 mg/dL (ref 0.2–1.0)
pH, UA: 5 (ref 5.0–7.5)

## 2022-09-06 LAB — MICROSCOPIC EXAMINATION: Bacteria, UA: NONE SEEN

## 2022-09-07 ENCOUNTER — Other Ambulatory Visit: Payer: Self-pay | Admitting: Urology

## 2022-09-07 DIAGNOSIS — R109 Unspecified abdominal pain: Secondary | ICD-10-CM

## 2022-09-12 LAB — MYCOPLASMA / UREAPLASMA CULTURE
Mycoplasma hominis Culture: NEGATIVE
Ureaplasma urealyticum: NEGATIVE

## 2023-01-04 ENCOUNTER — Encounter: Payer: Self-pay | Admitting: Physician Assistant

## 2023-01-04 ENCOUNTER — Ambulatory Visit: Payer: Self-pay | Admitting: Physician Assistant

## 2023-01-04 DIAGNOSIS — Z1152 Encounter for screening for COVID-19: Secondary | ICD-10-CM

## 2023-01-04 LAB — POC COVID19 BINAXNOW: SARS Coronavirus 2 Ag: POSITIVE — AB

## 2023-01-04 LAB — POCT INFLUENZA A/B
Influenza A, POC: NEGATIVE
Influenza B, POC: NEGATIVE

## 2023-01-04 MED ORDER — PROMETHAZINE-DM 6.25-15 MG/5ML PO SYRP: 5.0000 mL | ORAL_SOLUTION | Freq: Four times a day (QID) | ORAL | 0 refills | Status: DC | PRN

## 2023-01-04 MED ORDER — NIRMATRELVIR/RITONAVIR (PAXLOVID)TABLET: 3.0000 | ORAL_TABLET | Freq: Two times a day (BID) | ORAL | 0 refills | Status: DC

## 2023-01-04 MED ORDER — IBUPROFEN 800 MG PO TABS: 800.0000 mg | ORAL_TABLET | Freq: Three times a day (TID) | ORAL | 0 refills | Status: DC | PRN

## 2023-01-04 NOTE — Progress Notes (Signed)
Pt presents today for covid and flu testing due to symptoms since last night, pt was previously exposed to covid in the last week.  Symptoms are cough, chills, head ache, and excessive dry throat.

## 2023-01-04 NOTE — Progress Notes (Signed)
   Subjective: Viral illness    Patient ID: Jacob Payne, male    DOB: 1991/12/04, 32 y.o.   MRN: [id number]  HPI Patient presents with 1 day of fever, chills, body aches, cough, and sore throat.  Denies recent travel or known contact with COVID-19.  Patient not had a booster for COVID-19 over a year.  Patient test positive for COVID-19 this morning.   Review of Systems     Objective:   Physical Exam  Deferred secondary to telephonic encounter      Assessment & Plan: COVID-19   Patient given prescription for pack severe, Phenergan DM, and ibuprofen.  Patient advised to follow CDC recommendation.

## 2023-01-05 ENCOUNTER — Other Ambulatory Visit: Payer: Self-pay

## 2023-01-05 DIAGNOSIS — U071 COVID-19: Secondary | ICD-10-CM

## 2023-01-05 MED ORDER — NIRMATRELVIR/RITONAVIR (PAXLOVID)TABLET: 3.0000 | ORAL_TABLET | Freq: Two times a day (BID) | ORAL | 0 refills | Status: AC

## 2023-01-06 ENCOUNTER — Other Ambulatory Visit: Payer: Self-pay | Admitting: Physician Assistant

## 2023-01-06 MED ORDER — LIDOCAINE VISCOUS HCL 2 % MT SOLN: 5.0000 mL | Freq: Four times a day (QID) | OROMUCOSAL | 0 refills | Status: DC | PRN

## 2023-01-14 DIAGNOSIS — J019 Acute sinusitis, unspecified: Secondary | ICD-10-CM | POA: Diagnosis not present

## 2023-04-27 ENCOUNTER — Ambulatory Visit: Payer: Self-pay

## 2023-04-27 DIAGNOSIS — Z0000 Encounter for general adult medical examination without abnormal findings: Secondary | ICD-10-CM

## 2023-04-27 LAB — POCT URINALYSIS DIPSTICK
Bilirubin, UA: NEGATIVE
Blood, UA: NEGATIVE
Glucose, UA: NEGATIVE
Ketones, UA: NEGATIVE
Leukocytes, UA: NEGATIVE
Nitrite, UA: NEGATIVE
Protein, UA: NEGATIVE
Spec Grav, UA: 1.02 (ref 1.010–1.025)
Urobilinogen, UA: 0.2 E.U./dL
pH, UA: 6 (ref 5.0–8.0)

## 2023-04-27 NOTE — Progress Notes (Signed)
Pt completed labs for scheduled physical. ?

## 2023-04-28 LAB — CMP12+LP+TP+TSH+6AC+CBC/D/PLT
ALT: 42 IU/L (ref 0–44)
AST: 29 IU/L (ref 0–40)
Albumin/Globulin Ratio: 2.1 (ref 1.2–2.2)
Albumin: 5.1 g/dL (ref 4.1–5.1)
Alkaline Phosphatase: 54 IU/L (ref 44–121)
BUN/Creatinine Ratio: 12 (ref 9–20)
BUN: 14 mg/dL (ref 6–20)
Basophils Absolute: 0 10*3/uL (ref 0.0–0.2)
Basos: 1 %
Bilirubin Total: 0.6 mg/dL (ref 0.0–1.2)
Calcium: 10 mg/dL (ref 8.7–10.2)
Chloride: 98 mmol/L (ref 96–106)
Chol/HDL Ratio: 3.7 ratio (ref 0.0–5.0)
Cholesterol, Total: 172 mg/dL (ref 100–199)
Creatinine, Ser: 1.16 mg/dL (ref 0.76–1.27)
EOS (ABSOLUTE): 0.1 10*3/uL (ref 0.0–0.4)
Eos: 2 %
Estimated CHD Risk: 0.6 times avg. (ref 0.0–1.0)
Free Thyroxine Index: 2.6 (ref 1.2–4.9)
GGT: 22 IU/L (ref 0–65)
Globulin, Total: 2.4 g/dL (ref 1.5–4.5)
Glucose: 92 mg/dL (ref 70–99)
HDL: 46 mg/dL (ref >39)
Hematocrit: 49 % (ref 37.5–51.0)
Hemoglobin: 16.3 g/dL (ref 13.0–17.7)
Immature Grans (Abs): 0 10*3/uL (ref 0.0–0.1)
Immature Granulocytes: 0 %
Iron: 171 ug/dL — ABNORMAL HIGH (ref 38–169)
LDH: 201 IU/L (ref 121–224)
LDL Chol Calc (NIH): 98 mg/dL (ref 0–99)
Lymphocytes Absolute: 1.8 10*3/uL (ref 0.7–3.1)
Lymphs: 36 %
MCH: 29.9 pg (ref 26.6–33.0)
MCHC: 33.3 g/dL (ref 31.5–35.7)
MCV: 90 fL (ref 79–97)
Monocytes Absolute: 0.4 10*3/uL (ref 0.1–0.9)
Monocytes: 9 %
Neutrophils Absolute: 2.6 10*3/uL (ref 1.4–7.0)
Neutrophils: 52 %
Phosphorus: 3.4 mg/dL (ref 2.8–4.1)
Platelets: 170 10*3/uL (ref 150–450)
Potassium: 4.1 mmol/L (ref 3.5–5.2)
RBC: 5.45 x10E6/uL (ref 4.14–5.80)
RDW: 12.3 % (ref 11.6–15.4)
Sodium: 137 mmol/L (ref 134–144)
T3 Uptake Ratio: 28 % (ref 24–39)
T4, Total: 9.4 ug/dL (ref 4.5–12.0)
TSH: 3.22 u[IU]/mL (ref 0.450–4.500)
Total Protein: 7.5 g/dL (ref 6.0–8.5)
Triglycerides: 159 mg/dL — ABNORMAL HIGH (ref 0–149)
Uric Acid: 6.2 mg/dL (ref 3.8–8.4)
VLDL Cholesterol Cal: 28 mg/dL (ref 5–40)
WBC: 5 10*3/uL (ref 3.4–10.8)
eGFR: 86 mL/min/{1.73_m2} (ref >59)

## 2023-05-02 ENCOUNTER — Encounter: Payer: Self-pay | Admitting: Physician Assistant

## 2023-05-02 ENCOUNTER — Ambulatory Visit: Payer: Self-pay | Admitting: Physician Assistant

## 2023-05-02 VITALS — BP 125/85 | HR 85 | Temp 97.7°F | Resp 14 | Ht 70.0 in | Wt 220.0 lb

## 2023-05-02 DIAGNOSIS — Z0000 Encounter for general adult medical examination without abnormal findings: Secondary | ICD-10-CM

## 2023-05-02 NOTE — Progress Notes (Signed)
Here for annual physical with [facility].  Stated concerns of possible sleep apnea and wishes to evaluate it and family history reported.

## 2023-05-02 NOTE — Progress Notes (Signed)
[facility] ____________________________________________   None    (approximate)  I have reviewed the triage vital signs and the nursing notes.   HISTORY  Chief Complaint No chief complaint on file.  HPI Jacob Payne is a 32 y.o. male patient presents for annual physical exam.  Voices concern for sleep apnea.  States he is feeling fatigued with a.m. awakening, dry mouth upon awakening, and reported snoring via wife.        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   History of nephrolithiasis 11/12/2012    Past Surgical History:  Procedure Laterality Date   APPENDECTOMY     CYSTOSCOPY W/ RETROGRADES Left 04/21/2020   Procedure: CYSTOSCOPY WITH RETROGRADE PYELOGRAM;  Surgeon: Riki Altes, MD;  Location: [facility];  Service: Urology;  Laterality: Left;   CYSTOSCOPY WITH URETEROSCOPY, STONE BASKETRY AND STENT PLACEMENT Left 04/21/2020   Procedure: CYSTOSCOPY WITH URETEROSCOPY, STONE BASKETRY AND STENT PLACEMENT;  Surgeon: Riki Altes, MD;  Location: [facility];  Service: Urology;  Laterality: Left;   ESOPHAGOGASTRODUODENOSCOPY (EGD) WITH PROPOFOL N/A 08/31/2021   Procedure: ESOPHAGOGASTRODUODENOSCOPY (EGD) WITH PROPOFOL;  Surgeon: Pasty Spillers, MD;  Location: [facility];  Service: Endoscopy;  Laterality: N/A;   EXTRACORPOREAL SHOCK WAVE LITHOTRIPSY Left 04/16/2020   Procedure: EXTRACORPOREAL SHOCK WAVE LITHOTRIPSY (ESWL);  Surgeon: Riki Altes, MD;  Location: [facility];  Service: Urology;  Laterality: Left;   POLYPECTOMY N/A 08/31/2021   Procedure: POLYPECTOMY;  Surgeon: Pasty Spillers, MD;  Location: [facility];  Service: Endoscopy;  Laterality: N/A;     Prior to Admission medications   Not on File    Allergies Patient has no known allergies.  Family History  Problem Relation Age of Onset   Diabetes Father    Cancer Maternal Grandfather     Social History Social History   Tobacco Use   Smoking status: Never    Passive exposure: Never   Smokeless tobacco: Never  Substance Use Topics   Alcohol use: Never   Drug use: Never    Review of Systems Constitutional: No fever/chills Eyes: No visual changes. ENT: No sore throat. Cardiovascular: Denies chest pain. Respiratory: Denies shortness of breath. Gastrointestinal: No abdominal pain.  No nausea, no vomiting.  No diarrhea.  No constipation.  GERD Genitourinary: Negative for dysuria. Musculoskeletal: Negative for back pain. Skin: Negative for rash. Neurological: Negative for headaches, focal weakness or numbness.  ____________________________________________   PHYSICAL EXAM:  VITAL SIGNS: BP 125/85  Pulse 85  Resp 14  Temp 97.7 F (36.5 C)  SpO2 96 %  Weight 220 lb (99.8 kg)  Height 5\' 10"  (1.778 m)   BMI 31.57 kg/m2  BSA 2.22 m2  Constitutional: Alert and oriented. Well appearing and in no acute distress. Eyes: Conjunctivae are normal. PERRL. EOMI. Head: Atraumatic. Nose: No congestion/rhinnorhea. Mouth/Throat: Mucous membranes are moist.  Oropharynx non-erythematous. Neck: No stridor.  No cervical spine tenderness to palpation. Hematological/Lymphatic/Immunilogical: No cervical lymphadenopathy. Cardiovascular: Normal rate, regular rhythm. Grossly normal heart sounds.  Good peripheral circulation. Respiratory: Normal respiratory effort.  No retractions. Lungs CTAB. Gastrointestinal: Soft and nontender. No distention. No abdominal bruits. No CVA tenderness. Genitourinary: Deferred Musculoskeletal: No  lower extremity tenderness nor edema.  No joint effusions. Neurologic:  Normal speech and language. No gross focal neurologic deficits are appreciated. No  gait instability. Skin:  Skin is warm, dry and intact. No rash noted. Psychiatric: Mood and affect are normal. Speech and behavior are normal.  ____________________________________________   LABS  5 d ago 7 mo ago 8 mo ago 9 mo ago 11 mo ago 1 yr ago 2 yr ago   Color, UA yellow Yellow R Yellow R  Amber Yellow R Yellow R  Clarity, UA clear    Clear    Glucose, UA Negative Negative    Negative    Bilirubin, UA neg Negative R Negative R  Negative Negative R Negative R  Ketones, UA neg Negative R Negative R  Negative Negative R Negative R  Spec Grav, UA 1.010 - 1.025 1.020 1.025 R 1.025 R  >=1.030 Abnormal  1.015 R 1.020 R  Blood, UA neg Negative R Negative R  Negative Negative R Negative R  pH, UA 5.0 - 8.0 6.0 5.0 R 5.0 R  5.5 7.0 R 7.0 R  Protein, UA Negative Negative Negative R Negative R  Positive Abnormal  CM Negative R Negative R  Urobilinogen, UA 0.2 or 1.0 E.U./dL 0.2    0.2    Nitrite, UA neg Negative R Negative R  Negative Negative R Negative R  Leukocytes, UA Negative Negative Negative Negative  Negative Negative Negative  Appearance    CLEAR R     Odor         Resulting Agency  [facility]  [facility]                   Component Ref Range & Units 5 d ago (04/27/23) 11 mo ago (05/19/22) 1 yr ago (07/23/21) 1 yr ago (07/23/21) 1 yr ago (07/12/21) 1 yr ago (07/06/21) 1 yr ago (05/24/21)  Glucose 70 - 99 mg/dL 92 010 High  272 High  R    109 High  R  Uric Acid 3.8 - 8.4 mg/dL 6.2 6.3 CM     6.3 CM  Comment:            Therapeutic target for gout patients: <6.0  BUN 6 - 20 mg/dL 14 14 11    14   Creatinine, Ser 0.76 - 1.27 mg/dL 5.36 6.44 0.34 High     1.22  eGFR >59 mL/min/1.73 86 86 76    82  BUN/Creatinine Ratio 9 - 20 12 12 8  Low     11  Sodium 134 - 144 mmol/L 137 141 141    142  Potassium 3.5 - 5.2 mmol/L 4.1 4.9 4.6    4.4  Chloride 96 - 106 mmol/L 98 102 101    101  Calcium 8.7 - 10.2 mg/dL 74.2 9.8 59.5    9.8  Phosphorus 2.8 -  4.1 mg/dL 3.4 3.7     3.7  Total Protein 6.0 - 8.5 g/dL 7.5 7.0 7.4    7.9  Albumin 4.1 - 5.1 g/dL 5.1 4.8 R 5.2 R    5.2 R  Globulin, Total 1.5 - 4.5 g/dL 2.4 2.2 2.2    2.7  Albumin/Globulin Ratio 1.2 - 2.2 2.1 2.2 2.4 High     1.9  Bilirubin Total 0.0 - 1.2 mg/dL 0.6 0.4 0.3    0.3  Alkaline Phosphatase 44 - 121 IU/L 54 56 64    82  LDH 121 - 224 IU/L 201  180     177  AST 0 - 40 IU/L 29 35 30    26  ALT 0 - 44 IU/L 42 73 High  29    49 High   GGT 0 - 65 IU/L 22 22     20   Iron 38 - 169 ug/dL 161 High  096   045 409 134  Cholesterol, Total 100 - 199 mg/dL 811 914     782  Triglycerides 0 - 149 mg/dL 956 High  213 High      197 High   HDL >39 mg/dL 46 40     44  VLDL Cholesterol Cal 5 - 40 mg/dL 28 31     34  LDL Chol Calc (NIH) 0 - 99 mg/dL 98 086 High      578 High   Chol/HDL Ratio 0.0 - 5.0 ratio 3.7 4.6 CM     4.1 CM  Comment:                                   T. Chol/HDL Ratio                                             Men  Women                               1/2 Avg.Risk  3.4    3.3                                   Avg.Risk  5.0    4.4                                2X Avg.Risk  9.6    7.1                                3X Avg.Risk 23.4   11.0  Estimated CHD Risk 0.0 - 1.0 times avg. 0.6 0.9 CM     0.8 CM  Comment: The CHD Risk is based on the T. Chol/HDL ratio. Other factors affect CHD Risk such as hypertension, smoking, diabetes, severe obesity, and family history of premature CHD.  TSH 0.450 - 4.500 uIU/mL 3.220 2.660     3.910  T4, Total 4.5 - 12.0 ug/dL 9.4 9.1     9.4  T3 Uptake Ratio 24 - 39 % 28 28     27   Free Thyroxine Index 1.2 - 4.9 2.6 2.5     2.5  WBC 3.4 - 10.8 x10E3/uL 5.0 6.1  5.0   CANCELED R, CM  RBC 4.14 - 5.80 x10E6/uL 5.45 5.28  5.09   CANCELED R, CM  Hemoglobin 13.0 - 17.7 g/dL 46.9 62.9  52.8   CANCELED R, CM  Hematocrit 37.5 - 51.0 % 49.0 46.3  45.4   CANCELED R, CM  MCV 79 - 97 fL 90 88  89   CANCELED R, CM  MCH 26.6  - 33.0 pg 29.9 30.9  31.0   CANCELED R, CM  MCHC 31.5 - 35.7  g/dL 86.5 78.4  69.6   CANCELED R, CM  RDW 11.6 - 15.4 % 12.3 12.2  11.9   CANCELED R, CM  Platelets 150 - 450 x10E3/uL 170 194  187   CANCELED R, CM  Neutrophils Not Estab. % 52 59     CANCELED R, CM  Lymphs Not Estab. % 36 31     CANCELED R, CM  Monocytes Not Estab. % 9 7     CANCELED R, CM  Eos Not Estab. % 2 2     CANCELED R, CM  Basos Not Estab. % 1 1     CANCELED R, CM  Neutrophils Absolute 1.4 - 7.0 x10E3/uL 2.6 3.6     CANCELED R, CM  Lymphocytes Absolute 0.7 - 3.1 x10E3/uL 1.8 1.9     CANCELED R, CM  Monocytes Absolute 0.1 - 0.9 x10E3/uL 0.4 0.5     CANCELED R, CM  EOS (ABSOLUTE) 0.0 - 0.4 x10E3/uL 0.1 0.1     CANCELED R, CM  Basophils Absolute 0.0 - 0.2 x10E3/uL 0.0 0.0     CANCELED R, CM  Immature Granulocytes Not Estab. % 0 0     CANCELED R, CM  Immature Grans (Abs)             ____________________________________________    ____________________________________________   INITIAL IMPRESSION / ASSESSMENT AND PLAN  As part of my medical decision making, I reviewed the following data within the electronic MEDICAL RECORD NUMBER      No acute findings on physical exam.        ____________________________________________   FINAL CLINICAL IMPRESSION Well exam   ED Discharge Orders     None        Note:  This document was prepared using Dragon voice recognition software and may include unintentional dictation errors.

## 2023-10-18 ENCOUNTER — Encounter: Payer: Self-pay | Admitting: Internal Medicine

## 2023-10-18 DIAGNOSIS — G4719 Other hypersomnia: Secondary | ICD-10-CM

## 2023-10-24 ENCOUNTER — Encounter: Payer: Self-pay | Admitting: Physician Assistant

## 2023-10-24 ENCOUNTER — Ambulatory Visit: Payer: Self-pay | Admitting: Physician Assistant

## 2023-10-24 ENCOUNTER — Other Ambulatory Visit: Payer: Self-pay | Admitting: Physician Assistant

## 2023-10-24 VITALS — BP 141/98 | HR 84 | Temp 98.0°F | Resp 16 | Ht 70.0 in | Wt 225.0 lb

## 2023-10-24 DIAGNOSIS — L231 Allergic contact dermatitis due to adhesives: Secondary | ICD-10-CM

## 2023-10-24 MED ORDER — HYDROXYZINE PAMOATE 25 MG PO CAPS: 25.0000 mg | ORAL_CAPSULE | Freq: Three times a day (TID) | ORAL | 0 refills | Status: DC | PRN

## 2023-10-24 MED ORDER — HYDROCORTISONE VALERATE 0.2 % EX CREA: 1.0000 | TOPICAL_CREAM | Freq: Two times a day (BID) | CUTANEOUS | 0 refills | Status: DC

## 2023-10-24 NOTE — Progress Notes (Signed)
Sleepy study last Wednesday, oct pt states they used a cream to help leads stick. Pt believes he is allergic due having rash on scalp. He describes it as giving sharp pain through out the day and itches. Rash started mostly likely the next day oct 31 24.

## 2023-10-24 NOTE — Progress Notes (Signed)
   Subjective:Rash    Patient ID: Jacob Payne, male    DOB: [birth year], 32 y.o.   MRN: [id number]  HPI  Patient complaining of rash to the right frontal aspect of scalp for 1 week.  Patient arrested.  Status post using a piece to attach recording wires for sleep study.  Rash is localized with small vesicular papular lesions and itching.  No palliative measure for complaint.   Review of Systems Negative except for above complaint    Objective:   Physical Exam BP 141/98  Pulse 84  Resp 16  Temp 98 F (36.7 C)  Temp src Temporal  SpO2 98 %  Weight 225 lb (102.1 kg)  Height 5\' 10"  (1.778 m)  Patient has a area of papular /vesicle lesion to the right frontal area of scalp.  No signs or symptoms secondary infection.      Assessment & Plan: Contact dermatitis  Patient given a prescription for Westcort and Atarax.  Patient advised to take picture of lesion.  Patient vies follow-up 1 week if no improvement or worsening complaint.

## 2023-11-03 NOTE — Procedures (Signed)
[facility]  Polysomnogram Report Part I                                                               Phone: [phone] Fax: [phone]  Patient Name: Jacob Payne, Jacob Payne. Acquisition Number: [id number]  Date of Birth: Mar 07, 1991 Acquisition Date: 10/18/2023  Referring Physician: Joni Reining PA-C     History: The patient is a 32 year old  who was referred for evaluation of possible sleep apnea with snoring and excessive daytime sleepiness. Medical History: Fatigue, elevated fasting glucose, elevated LFTs, GERD, overweight.  Medications: doxcycycline, ibuprofen, lidocaine, promethazine-dextromethorphan, tamsulosin.  Procedure: This routine overnight polysomnogram was performed on the Alice 5 using the standard diagnostic protocol. This included 6 channels of EEG, 2 channels of EOG, chin EMG, bilateral anterior tibialis EMG, nasal/oral thermistor, PTAF (nasal pressure transducer), chest and abdominal wall movements, EKG, and pulse oximetry.  Description: The total recording time was 443.5 minutes. The total sleep time was 370.0 minutes. There were a total of 70.5 minutes of wakefulness after sleep onset for a reducedsleep efficiency of 83.4%. The latency to sleep onset was shortat 3.0 minutes. The R sleep onset latency waswithin normal limits at 107.0 minutes. Sleep parameters, as a percentage of the total sleep time, demonstrated 2.6% of sleep was in N1 sleep, 49.6% N2, 30.7% N3 and 17.2% R sleep. There were a total of 45 arousals for an arousal index of 7.3 arousals per hour of sleep that was normal.  Respiratory monitoring demonstrated   snoring. All sleep was in the supine position. There were 54 apneas and hypopneas for an Apnea Hypopnea Index of 8.8 apneas and hypopneas per hour of sleep. The REM related apnea hypopnea index was 34.0/hr of REM sleep compared to a NREM AHI of 3.5/hr.  The average duration of the respiratory events was 13.4 seconds with a maximum duration of  23.5 seconds. The respiratory events were associated with peripheral oxygen desaturations on the average to 90%. The lowest oxygen desaturation associated with a respiratory event was 82%. Additionally, the baseline oxygen saturation during wakefulness was 95%, during NREM sleep averaged 94%, and during REM sleep averaged  95%. The total duration of oxygen < 90% was 1.9 minutes.  Cardiac monitoring-  demonstrate transient cardiac decelerations associated with the apneas.  significant cardiac rhythm irregularities.   Periodic limb movement monitoring- did not demonstrate periodic limb movements.   Impression: This routine overnight polysomnogram demonstrated significant, REM-related obstructive sleep apnea with an overall Apnea Hypopnea Index of 8.8 apneas and hypopneas per hour of sleep, which increased to 34.0 in REM sleep. REM percentage was slightly reduced.The lowest desaturation was to 82%.  All sleep was in the supine position.  reduced sleep efficiency withincreased awakeningsand a slightly reduced REM percentage.These findings would appear to be due to the obstructive sleep.  Recommendations:    A CPAP titration would be recommended for the sleep apnea.  Would recommend weight loss in a patient with a BMI of 32.5  Alternative treatment options may include an oral appliance or ENT surgery in the appropriate clinical context.     Yevonne Pax, MD, [facility] Diplomate ABMS-Pulmonary, Critical Care and Sleep Medicine  Electronically reviewed and digitally signed   [facility] Polysomnogram Report Part II  Phone: [phone] Fax: [phone]  Patient last name [name] Neck Size 17.0 in. Acquisition [phone]  Patient first name Jacob Payne. Weight 220.0 lbs. Started 10/18/2023 at 9:57:29 PM  Birth date 1991/03/13 Height 69.0 in. Stopped 10/19/2023 at 5:34:59 AM  Age 61 BMI 32.5 lb/in2 Duration 443.5  Study Type Adult      Robbi Garter. RPSGT. / Loura Back    Reviewed by: Valentino Hue. Henke, PhD, ABSM, FAASM Sleep Data: Lights Out: 10:06:29 PM Sleep Onset: 10:09:29 PM  Lights On: 5:29:59 AM Sleep Efficiency: 83.4 %  Total Recording Time: 443.5 min Sleep Latency (from Lights Off) 3.0 min  Total Sleep Time (TST): 370.0 min R Latency (from Sleep Onset): 107.0 min  Sleep Period Time: 440.5 min Total number of awakenings: 20  Wake during sleep: 70.5 min Wake After Sleep Onset (WASO): 70.5 min   Sleep Data:         Arousal Summary: Stage  Latency from lights out (min) Latency from sleep onset (min) Duration (min) % Total Sleep Time  Normal values  N 1 3.0 0.0 9.5 2.6 (5%)  N 2 4.5 1.5 183.5 49.6 (50%)  N 3 52.5 49.5 113.5 30.7 (20%)  R 110.0 107.0 63.5 17.2 (25%)   Number Index  Spontaneous 36 5.8  Apneas & Hypopneas 10 1.6  RERAs 0 0.0       (Apneas & Hypopneas & RERAs)  (10) (1.6)  Limb Movement 0 0.0  Snore 0 0.0  TOTAL 46 7.5     Respiratory Data:  CA OA MA Apnea Hypopnea* A+ H RERA Total  Number 0 16 0 16 38 54 0 54  Mean Dur (sec) 0.0 13.0 0.0 13.0 13.6 13.4 0.0 13.4  Max Dur (sec) 0.0 17.5 0.0 17.5 23.5 23.5 0.0 23.5  Total Dur (min) 0.0 3.5 0.0 3.5 8.6 12.1 0.0 12.1  % of TST 0.0 0.9 0.0 0.9 2.3 3.3 0.0 3.3  Index (#/h TST) 0.0 2.6 0.0 2.6 6.2 8.8 0.0 8.8  *Hypopneas scored based on 4% or greater desaturation.  Sleep Stage:        REM NREM TST  AHI 34.0 3.5 8.8  RDI 34.0 3.5 8.8           Body Position Data:  Sleep (min) TST (%) REM (min) NREM (min) CA (#) OA (#) MA (#) HYP (#) AHI (#/h) RERA (#) RDI (#/h) Desat (#)  Supine 370.0 100.00 63.5 306.5 0 16 0 38 8.8 0 8.8 57  Non-Supine 0.00 0.00 0.00 0.00 0.00 0.00 0.00 0.00 0.00 0 0.00 0.00     Snoring: Total number of snoring episodes  0  Total time with snoring    min (   % of sleep)   Oximetry Distribution:             WK REM NREM TOTAL  Average (%)   95 95 94 94  < 90% 0.4 1.4 0.1 1.9  < 80% 0.0 0.0 0.0 0.0  < 70% 0.0 0.0 0.0 0.0  # of  Desaturations* 2 41 14 57  Desat Index (#/hour) 1.8 38.7 2.7 9.2  Desat Max (%) 5 15 8 15   Desat Max Dur (sec) 69.0 56.0 95.0 95.0  Approx Min O2 during sleep 82  Approx min O2 during a respiratory event 82  Was Oxygen added (Y/N) and final rate :    LPM  *Desaturations based on 3% or greater drop from baseline.   Cheyne Stokes Breathing: None Present  Heart Rate Summary:  Average Heart Rate During Sleep 71.0 bpm      Highest Heart Rate During Sleep (95th %) 78.0 bpm      Highest Heart Rate During Sleep 207 bpm (artifact)  Highest Heart Rate During Recording (TIB) 208 bpm (artifact)   Heart Rate Observations: Event Type # Events   Bradycardia 0 Lowest HR Scored: N/A  Sinus Tachycardia During Sleep 0 Highest HR Scored: N/A  Narrow Complex Tachycardia 0 Highest HR Scored: N/A  Wide Complex Tachycardia 0 Highest HR Scored: N/A  Asystole 0 Longest Pause: N/A  Atrial Fibrillation 0 Duration Longest Event: N/A  Other Arrythmias   Type:    Periodic Limb Movement Data: (Primary legs unless otherwise noted) Total # Limb Movement 0 Limb Movement Index 0.0  Total # PLMS    PLMS Index     Total # PLMS Arousals    PLMS Arousal Index     Percentage Sleep Time with PLMS   min (   % sleep)  Mean Duration limb movements (secs)

## 2023-11-24 ENCOUNTER — Encounter: Payer: Self-pay | Admitting: Internal Medicine

## 2023-11-24 DIAGNOSIS — G4733 Obstructive sleep apnea (adult) (pediatric): Secondary | ICD-10-CM

## 2023-11-28 NOTE — Procedures (Signed)
[facility]  Polysomnogram Report Part I  Phone: [phone] Fax: [phone]  Patient Name: Jacob Payne, Jacob Payne Acquisition Number: [id number]  Date of Birth: 30-Nov-1991 Acquisition Date: 11/24/2023  Referring Physician: Joni Reining PA-C     History: The patient is a 32 year old  . Medical History: fatigue, elevated fasting glucose, elevated LFTs, GERD, overweight.  Medications: doxcycycline, ibuprofen, lidocaine, promethazine-dextromethorphan, tamsulosin.  Procedure: This routine overnight polysomnogram was performed on the Alice 5 using the standard CPAP protocol. This included 6 channels of EEG, 2 channels of EOG, chin EMG, bilateral anterior tibialis EMG, nasal/oral thermistor, PTAF (nasal pressure transducer), chest and abdominal wall movements, EKG, and pulse oximetry.  Description: The total recording time was 415.0 minutes. The total sleep time was 392.5 minutes. There were a total of 19.5 minutes of wakefulness after sleep onset for a goodsleep efficiency of 94.6%. The latency to sleep onset was shortat 3.0 minutes. The R sleep onset latency was prolonged at 150.0 minutes. Sleep parameters, as a percentage of the total sleep time, demonstrated 3.4% of sleep was in N1 sleep, 63.2% N2, 19.2% N3 and 14.1% R sleep. There were a total of 17 arousals for an arousal index of 2.6 arousals per hour of sleep that was normal.  Overall, there were a total of 15 respiratory events for a respiratory disturbance index, which includes apneas, hypopneas and RERAs (increased respiratory effort) of 2.3 respiratory events per hour of sleep during the pressure titration.  was initiated at 4 cm H2O at lights out, 10:35 p.m. It was titrated in 1 cm increments for occasional respiratory events to the final pressure of 7 cm H2O. The apnea was controlled at the final pressure and REM sleep was observed. All sleep was in the supine position.  Additionally, the baseline oxygen saturation during  wakefulness was 95%, during NREM sleep averaged 94%, and during REM sleep averaged 94%. The total duration of oxygen < 90% was 1.5 minutes.  Cardiac monitoring-  significant cardiac rhythm irregularities.   Periodic limb movement monitoring- demonstrated that there were 15 periodic limb movements for a periodic limb movement index of 2.3 periodic limb movements per hour of sleep.   Impression: This patient's obstructive sleep apnea demonstrated significant improvement with the utilization of nasal  at 7 cm H2O.   There were few periodic limb movements that commonly are not significant. Clinical correlation would be suggested.   Recommendations: Would recommend utilization of nasal  at 7 cm H2O.      An AirFit F40 mask, size Medium, was used. Chin strap used during study- No. Humidifier used during study- Yes.     Yevonne Pax, MD, [facility] Diplomate ABMS-Pulmonary, Critical Care and Sleep Medicine  Electronically reviewed and digitally signed   [facility] CPAP/BIPAP Polysomnogram Report Part II Phone: [phone] Fax: [phone]  Patient last name Perine Neck Size 17.0 in. Acquisition [id number]  Patient first name Jacob Payne Weight 220.0 lbs. Started 11/24/2023 at 10:21:03 PM  Birth date December 02, 1991 Height 69.0 in. Stopped 11/25/2023 at 5:43:51 AM  Age 73      Type Adult BMI 32.5 lb/in2 Duration 415.0  Robbi Garter. RPSGT. / Loura Back  Reviewed by: Valentino Hue. Henke, PhD, ABSM, FAASM Sleep Data: Lights Out: 10:35:33 PM Sleep Onset: 10:38:33 PM  Lights On: 5:30:33 AM Sleep Efficiency: 94.6 %  Total Recording Time: 415.0 min Sleep Latency (from Lights Off) 3.0 min  Total Sleep Time (TST): 392.5 min R Latency (from Sleep Onset): 150.0 min  Sleep Period Time: 409.5 min Total number of awakenings: 9  Wake during sleep: 17.0 min Wake After Sleep Onset (WASO): 19.5 min   Sleep Data:         Arousal Summary: Stage  Latency from lights out (min) Latency from sleep onset  (min) Duration (min) % Total Sleep Time  Normal values  N 1 79.0 76.0 13.5 3.4 (5%)  N 2 3.0 0.0 248.0 63.2 (50%)  N 3 10.5 7.5 75.5 19.2 (20%)  R 153.0 150.0 55.5 14.1 (25%)   Number Index  Spontaneous 17 2.6  Apneas & Hypopneas 0 0.0  RERAs 0 0.0       (Apneas & Hypopneas & RERAs)  (0) (0.0)  Limb Movement 0 0.0  Snore 0 0.0  TOTAL 17 2.6     Respiratory Data:  CA OA MA Apnea Hypopnea* A+ H RERA Total  Number 0 0 0 0 15 15 0 15  Mean Dur (sec) 0.0 0.0 0.0 0.0 11.7 11.7 0.0 11.7  Max Dur (sec) 0.0 0.0 0.0 0.0 14.5 14.5 0.0 14.5  Total Dur (min) 0.0 0.0 0.0 0.0 2.9 2.9 0.0 2.9  % of TST 0.0 0.0 0.0 0.0 0.7 0.7 0.0 0.7  Index (#/h TST) 0.0 0.0 0.0 0.0 2.3 2.3 0.0 2.3  *Hypopneas scored based on 4% or greater desaturation.  Sleep Stage:         REM NREM TST  AHI 14.1 0.4 2.3  RDI 14.1 0.4 2.3   Sleep (min) TST (%) REM (min) NREM (min) CA (#) OA (#) MA (#) HYP (#) AHI (#/h) RERA (#) RDI (#/h) Desat (#)  Supine 392.5 100.00 55.5 337.0 0 0 0 15 2.3 0 2.3 19  Non-Supine 0.00 0.00 0.00 0.00 0.00 0.00 0.00 0.00 0.00 0 0.00 0.00     Snoring: Total number of snoring episodes  0  Total time with snoring    min (   % of sleep)   Oximetry Distribution:             WK REM NREM TOTAL  Average (%)   95 94 94 94  < 90% 0.0 1.5 0.0 1.5  < 80% 0.0 0.0 0.0 0.0  < 70% 0.0 0.0 0.0 0.0  # of Desaturations* 1 14 4 19   Desat Index (#/hour) 2.7 15.1 0.7 2.9  Desat Max (%) 4 12 7 12   Desat Max Dur (sec) 48.0 60.0 66.0 66.0  Approx Min O2 during sleep 83  Approx min O2 during a respiratory event 83  Was Oxygen added (Y/N) and final rate :    LPM  *Desaturations based on 4% or greater drop from baseline.   Cheyne Stokes Breathing: None Present    Heart Rate Summary:  Average Heart Rate During Sleep 72.7 bpm      Highest Heart Rate During Sleep (95th %) 80.0 bpm      Highest Heart Rate During Sleep 129 bpm      Highest Heart Rate During Recording (TIB) 167 bpm  (artifact)   Heart Rate Observations: Event Type # Events   Bradycardia 0 Lowest HR Scored: N/A  Sinus Tachycardia During Sleep 0 Highest HR Scored: N/A  Narrow Complex Tachycardia 0 Highest HR Scored: N/A  Wide Complex Tachycardia 0 Highest HR Scored: N/A  Asystole 0 Longest Pause: N/A  Atrial Fibrillation 0 Duration Longest Event: N/A  Other Arrythmias   Type:   Periodic Limb Movement Data: (Primary legs unless otherwise noted) Total # Limb Movement 15  Limb Movement Index 2.3  Total # PLMS 15 PLMS Index 2.3  Total # PLMS Arousals    PLMS Arousal Index     Percentage Sleep Time with PLMS 8.31min (2.1 % sleep)  Mean Duration limb movements (secs) 248.0    IPAP Level (cmH2O) EPAP Level (cmH2O) Total Duration (min) Sleep Duration (min) Sleep (%) REM (%) CA  #) OA # MA # HYP #) AHI (#/hr) RERAs # RERAs (#/hr) RDI (#/hr)  4 4 156.5 154.5 98.7 4.2 0 0 0 5 1.9 0 0.0 1.9  5 5  15.8 15.8 100.0 96.8 0 0 0 4 15.2 0 0.0 15.2  6 6  134.8 126.3 93.7 13.9 0 0 0 4 1.9 0 0.0 1.9  7 7  101.5 95.0 93.6 13.8 0 0 0 2 1.3 0 0.0 1.3

## 2023-12-23 ENCOUNTER — Telehealth: Payer: 59 | Admitting: Family Medicine

## 2023-12-23 DIAGNOSIS — A084 Viral intestinal infection, unspecified: Secondary | ICD-10-CM

## 2023-12-23 MED ORDER — ONDANSETRON 4 MG PO TBDP: 4.0000 mg | ORAL_TABLET | Freq: Three times a day (TID) | ORAL | 0 refills | Status: DC | PRN

## 2023-12-23 NOTE — Progress Notes (Signed)
 E-Visit for Nausea and Vomiting   We are sorry that you are not feeling well. Here is how we plan to help!  Based on what you have shared with me it looks like you have a Virus that is irritating your GI tract.  Vomiting is the forceful emptying of a portion of the stomach's content through the mouth.  Although nausea and vomiting can make you feel miserable, it's important to remember that these are not diseases, but rather symptoms of an underlying illness.  When we treat short term symptoms, we always caution that any symptoms that persist should be fully evaluated in a medical office.  I have prescribed a medication that will help alleviate your symptoms and allow you to stay hydrated:  Zofran 4 mg 1 tablet every 8 hours as needed for nausea and vomiting  HOME CARE: Drink clear liquids.  This is very important! Dehydration (the lack of fluid) can lead to a serious complication.  Start off with 1 tablespoon every 5 minutes for 8 hours. You may begin eating bland foods after 8 hours without vomiting.  Start with saltine crackers, white bread, rice, mashed potatoes, applesauce. After 48 hours on a bland diet, you may resume a normal diet. Try to go to sleep.  Sleep often empties the stomach and relieves the need to vomit.  GET HELP RIGHT AWAY IF:  Your symptoms do not improve or worsen within 2 days after treatment. You have a fever for over 3 days. You cannot keep down fluids after trying the medication.  MAKE SURE YOU:  Understand these instructions. Will watch your condition. Will get help right away if you are not doing well or get worse.    Thank you for choosing an e-visit.  Your e-visit answers were reviewed by a board certified advanced clinical practitioner to complete your personal care plan. Depending upon the condition, your plan could have included both over the counter or prescription medications.  Please review your pharmacy choice. Make sure the pharmacy is open so  you can pick up prescription now. If there is a problem, you may contact your provider through [employer] and have the prescription routed to another pharmacy.  Your safety is important to [place]. If you have drug allergies check your prescription carefully.   For the next 24 hours you can use MyChart to ask questions about today's visit, request a non-urgent call back, or ask for a work or school excuse. You will get an email in the next two days asking about your experience. I hope that your e-visit has been valuable and will speed your recovery.    have provided 5 minutes of non face to face time during this encounter for chart review and documentation.

## 2024-01-18 DIAGNOSIS — G4733 Obstructive sleep apnea (adult) (pediatric): Secondary | ICD-10-CM | POA: Diagnosis not present

## 2024-02-16 DIAGNOSIS — G4733 Obstructive sleep apnea (adult) (pediatric): Secondary | ICD-10-CM | POA: Diagnosis not present

## 2024-03-08 NOTE — Progress Notes (Unsigned)
 [facility] [address] [place], [place] [address]  Pulmonary Sleep Medicine   Office Visit Note  Patient Name: Jacob Payne DOB: 19-Jun-1991 MRN [id number]    Chief Complaint: Obstructive Sleep Apnea visit  Brief History:  Jacob Payne is seen today for an initial consult for CPAP@ 7 cmH2O. The patient has a 4 month history of sleep apnea. Patient is using PAP nightly.  The patient feels rested after sleeping with PAP.  The patient reports benefiting from PAP use. Reported sleepiness is improved and the Epworth Sleepiness Score is 4 out of 24. The patient will rarely take naps. The patient complains of the following: none. The compliance download shows 84 % compliance with an average use time of 7 hours 8 minutes. The AHI is 1.2. The patient does not complain of limb movements disrupting sleep. The patient continues to require PAP therapy in order to eliminate sleep apnea.   ROS  General: (-) fever, (-) chills, (-) night sweat Nose and Sinuses: (-) nasal stuffiness or itchiness, (-) postnasal drip, (-) nosebleeds, (-) sinus trouble. Mouth and Throat: (-) sore throat, (-) hoarseness. Neck: (-) swollen glands, (-) enlarged thyroid, (-) neck pain. Respiratory: - cough, - shortness of breath, - wheezing. Neurologic: - numbness, - tingling. Psychiatric: - anxiety, - depression   Current Medication: Outpatient Encounter Medications as of 03/11/2024  Medication Sig   [DISCONTINUED] hydrocortisone valerate cream (WESTCORT) 0.2 % Apply 1 Application topically 2 (two) times daily.   [DISCONTINUED] hydrOXYzine (VISTARIL) 25 MG capsule Take 1 capsule (25 mg total) by mouth every 8 (eight) hours as needed.   [DISCONTINUED] ondansetron (ZOFRAN-ODT) 4 MG disintegrating tablet Take 1 tablet (4 mg total) by mouth every 8 (eight) hours as needed for nausea or vomiting.   No facility-administered encounter medications on file as of 03/11/2024.    Surgical History: Past  Surgical History:  Procedure Laterality Date   APPENDECTOMY     CYSTOSCOPY W/ RETROGRADES Left 04/21/2020   Procedure: CYSTOSCOPY WITH RETROGRADE PYELOGRAM;  Surgeon: Riki Altes, MD;  Location: [facility];  Service: Urology;  Laterality: Left;   CYSTOSCOPY WITH URETEROSCOPY, STONE BASKETRY AND STENT PLACEMENT Left 04/21/2020   Procedure: CYSTOSCOPY WITH URETEROSCOPY, STONE BASKETRY AND STENT PLACEMENT;  Surgeon: Riki Altes, MD;  Location: [facility];  Service: Urology;  Laterality: Left;   ESOPHAGOGASTRODUODENOSCOPY (EGD) WITH PROPOFOL N/A 08/31/2021   Procedure: ESOPHAGOGASTRODUODENOSCOPY (EGD) WITH PROPOFOL;  Surgeon: Pasty Spillers, MD;  Location: [facility];  Service: Endoscopy;  Laterality: N/A;   EXTRACORPOREAL SHOCK WAVE LITHOTRIPSY Left 04/16/2020   Procedure: EXTRACORPOREAL SHOCK WAVE LITHOTRIPSY (ESWL);  Surgeon: Riki Altes, MD;  Location: [facility];  Service: Urology;  Laterality: Left;   POLYPECTOMY N/A 08/31/2021   Procedure: POLYPECTOMY;  Surgeon: Pasty Spillers, MD;  Location: [facility];  Service: Endoscopy;  Laterality: N/A;    Medical History: Past Medical History:  Diagnosis Date   Elevated fasting glucose    Elevated LFTs    GERD (gastroesophageal reflux disease)    Overweight     Family History: Non contributory to the present illness  Social History: Social History   Socioeconomic History   Marital status: Married    Spouse name: Not on file   Number of children: Not on file   Years of education: Not on file   Highest education level: Not on file  Occupational History   Not on file  Tobacco Use   Smoking status: Never    Passive exposure:  Never   Smokeless tobacco: Never  Substance and Sexual Activity   Alcohol use: Never   Drug use: Never   Sexual activity: Yes  Other Topics Concern   Not on file  Social History Narrative   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     Social Drivers of Health   Financial Resource Strain: Not on file  Food Insecurity: Not on file  Transportation Needs: Not on file  Physical Activity: Not on file  Stress: Not on file  Social Connections: Not on file  Intimate Partner Violence: Not on file    Vital Signs: Blood pressure (!) 134/91, pulse 81, resp. rate 16, height 5\' 9"  (1.753 m), weight 224 lb (101.6 kg), SpO2 98%. Body mass index is 33.08 kg/m.    Examination: General Appearance: The patient is well-developed, well-nourished, and in no distress. Neck Circumference: 40 cm Skin: Gross inspection of skin unremarkable. Head: normocephalic, no gross deformities. Eyes: no gross deformities noted. ENT: ears appear grossly normal Neurologic: Alert and oriented. No involuntary movements.  STOP BANG RISK ASSESSMENT S (snore) Have you been told that you snore?     NO   T (tired) Are you often tired, fatigued, or sleepy during the day?   NO  O (obstruction) Do you stop breathing, choke, or gasp during sleep? NO   P (pressure) Do you have or are you being treated for high blood pressure? NO   B (BMI) Is your body index greater than 35 kg/m? NO   A (age) Are you 9 years old or older? NO   N (neck) Do you have a neck circumference greater than 16 inches?   NO   G (gender) Are you a male? YES   TOTAL STOP/BANG "YES" ANSWERS 1       A STOP-Bang score of 2 or less is considered low risk, and a score of 5 or more is high risk for having either moderate or severe OSA. For people who score 3 or 4, doctors may need to perform further assessment to determine how likely they are to have OSA.         EPWORTH SLEEPINESS SCALE:  Scale:  (0)= no chance of dozing; (1)= slight chance of dozing; (2)= moderate chance of dozing; (3)= high chance of dozing  Chance  Situtation    Sitting and reading: 0    Watching TV: 1    Sitting Inactive in public: 0    As a passenger in car: 1      Lying down to rest: 2     Sitting and talking: 0    Sitting quielty after lunch: 0    In a car, stopped in traffic: 0   TOTAL SCORE:   4 out of 24    SLEEP STUDIES:  PSG - 10/18/2023 - AHI 8.8/hr, REM AHI 34/hr, min Sp02 82% Titration - . 11/24/2023 -   CPAP @ 7cmH20   CPAP COMPLIANCE DATA:  Date Range: 01/18/2024-03/06/2024  Average Daily Use: 7 hours 8 minutes  Median Use: 7 hours 20 minutes  Compliance for > 4 Hours: 84%  AHI: 1.2 respiratory events per hour  Days Used: 41/49 days  Mask Leak: 19.2  95th Percentile Pressure: 7         LABS: No results found for this or any previous visit (from the past 2160 hours).  Radiology: US RENAL Result Date: 08/18/2022 CLINICAL DATA:  Acute left flank pain for 3 weeks EXAM: RENAL /  URINARY TRACT ULTRASOUND COMPLETE COMPARISON:  None Available. FINDINGS: Right Kidney: Renal measurements: 10.5 x 5.1 x 6.0 cm = volume: 169 mL. Echogenicity within normal limits. No mass or hydronephrosis visualized. Left Kidney: Renal measurements: 11.4 x 5.2 x 5.3 cm = volume: 170 mL. Echogenicity within normal limits. No mass or hydronephrosis visualized. Bladder: There is a 24 cc postvoid residual. The bladder is otherwise normal in appearance. Other: None. IMPRESSION: 1. Mildly prominent bilateral renal pelvises without caliectasis. No hydronephrosis. No other abnormalities. Electronically Signed   By: Gerome Sam III M.D.   On: 08/18/2022 08:08    No results found.  No results found.    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 OSA (obstructive sleep apnea) (Primary) The patient does tolerate PAP and reports  benefit from PAP use. The patient was reminded how to clean equipment and advised to replace supplies routinely. The patient was also counselled on weight loss. The compliance is good.  The AHI is 1.2.   OSA on cpap- controlled. Continue with good compliance with pap. CPAP continues to be medically necessary to treat this patient's OSA. F/u one year.     2. CPAP use counseling CPAP Counseling: had a lengthy discussion with the patient regarding the importance of PAP therapy in management of the sleep apnea. Patient appears to understand the risk factor reduction and also understands the risks associated with untreated sleep apnea. Patient will try to make a good faith effort to remain compliant with therapy. Also instructed the patient on proper cleaning of the device including the water must be changed daily if possible and use of distilled water is preferred. Patient understands that the machine should be regularly cleaned with appropriate recommended cleaning solutions that do not damage the PAP machine for example given white vinegar and water rinses. Other methods such as ozone treatment may not be as good as these simple methods to achieve cleaning.     General Counseling: I have discussed the findings of the evaluation and examination with Jacob Payne.  I have also discussed any further diagnostic evaluation thatmay be needed or ordered today. Jacob Payne verbalizes understanding of the findings of todays visit. We also reviewed his medications today and discussed drug interactions and side effects including but not limited excessive drowsiness and altered mental states. We also discussed that there is always a risk not just to him but also people around him. he has been encouraged to call the office with any questions or concerns that should arise related to todays visit.  No orders of the defined types were placed in this encounter.       I have personally obtained a history, examined the patient, evaluated laboratory and imaging results, formulated the assessment and plan and placed orders. This patient was seen today by Emmaline Kluver, PA-C in collaboration with Dr. Freda Munro.   Jacob Pax, MD [facility] Diplomate ABMS Pulmonary Critical Care Medicine and Sleep Medicine

## 2024-03-11 ENCOUNTER — Ambulatory Visit (INDEPENDENT_AMBULATORY_CARE_PROVIDER_SITE_OTHER): Payer: Self-pay | Admitting: Internal Medicine

## 2024-03-11 VITALS — BP 134/91 | HR 81 | Resp 16 | Ht 69.0 in | Wt 224.0 lb

## 2024-03-11 DIAGNOSIS — G4733 Obstructive sleep apnea (adult) (pediatric): Secondary | ICD-10-CM

## 2024-03-11 DIAGNOSIS — Z7189 Other specified counseling: Secondary | ICD-10-CM

## 2024-03-11 NOTE — Patient Instructions (Signed)

## 2024-03-17 DIAGNOSIS — G4733 Obstructive sleep apnea (adult) (pediatric): Secondary | ICD-10-CM | POA: Diagnosis not present

## 2024-04-17 DIAGNOSIS — G4733 Obstructive sleep apnea (adult) (pediatric): Secondary | ICD-10-CM | POA: Diagnosis not present

## 2024-05-17 ENCOUNTER — Ambulatory Visit: Payer: Self-pay

## 2024-05-17 DIAGNOSIS — Z0000 Encounter for general adult medical examination without abnormal findings: Secondary | ICD-10-CM

## 2024-05-17 DIAGNOSIS — G4733 Obstructive sleep apnea (adult) (pediatric): Secondary | ICD-10-CM | POA: Diagnosis not present

## 2024-05-17 LAB — POCT URINALYSIS DIPSTICK
Bilirubin, UA: NEGATIVE
Blood, UA: NEGATIVE
Glucose, UA: NEGATIVE
Ketones, UA: NEGATIVE
Leukocytes, UA: NEGATIVE
Nitrite, UA: NEGATIVE
Protein, UA: NEGATIVE
Spec Grav, UA: 1.01 (ref 1.010–1.025)
Urobilinogen, UA: 0.2 U/dL
pH, UA: 6.5 (ref 5.0–8.0)

## 2024-05-18 LAB — CMP12+LP+TP+TSH+6AC+CBC/D/PLT
ALT: 69 IU/L — ABNORMAL HIGH (ref 0–44)
AST: 37 IU/L (ref 0–40)
Albumin: 4.9 g/dL (ref 4.1–5.1)
Alkaline Phosphatase: 55 IU/L (ref 44–121)
BUN/Creatinine Ratio: 11 (ref 9–20)
BUN: 11 mg/dL (ref 6–20)
Basophils Absolute: 0 10*3/uL (ref 0.0–0.2)
Basos: 0 %
Bilirubin Total: 0.4 mg/dL (ref 0.0–1.2)
Calcium: 9.7 mg/dL (ref 8.7–10.2)
Chloride: 100 mmol/L (ref 96–106)
Chol/HDL Ratio: 4 ratio (ref 0.0–5.0)
Cholesterol, Total: 152 mg/dL (ref 100–199)
Creatinine, Ser: 1.01 mg/dL (ref 0.76–1.27)
EOS (ABSOLUTE): 0.1 10*3/uL (ref 0.0–0.4)
Eos: 1 %
Estimated CHD Risk: 0.7 times avg. (ref 0.0–1.0)
Free Thyroxine Index: 2.4 (ref 1.2–4.9)
GGT: 19 IU/L (ref 0–65)
Globulin, Total: 2.3 g/dL (ref 1.5–4.5)
Glucose: 96 mg/dL (ref 70–99)
HDL: 38 mg/dL — ABNORMAL LOW (ref >39)
Hematocrit: 47.1 % (ref 37.5–51.0)
Hemoglobin: 15.8 g/dL (ref 13.0–17.7)
Immature Grans (Abs): 0 10*3/uL (ref 0.0–0.1)
Immature Granulocytes: 0 %
Iron: 106 ug/dL (ref 38–169)
LDH: 200 IU/L (ref 121–224)
LDL Chol Calc (NIH): 89 mg/dL (ref 0–99)
Lymphocytes Absolute: 1.7 10*3/uL (ref 0.7–3.1)
Lymphs: 31 %
MCH: 30.7 pg (ref 26.6–33.0)
MCHC: 33.5 g/dL (ref 31.5–35.7)
MCV: 92 fL (ref 79–97)
Monocytes Absolute: 0.5 10*3/uL (ref 0.1–0.9)
Monocytes: 8 %
Neutrophils Absolute: 3.3 10*3/uL (ref 1.4–7.0)
Neutrophils: 60 %
Phosphorus: 2.6 mg/dL — ABNORMAL LOW (ref 2.8–4.1)
Platelets: 189 10*3/uL (ref 150–450)
Potassium: 4.5 mmol/L (ref 3.5–5.2)
RBC: 5.15 x10E6/uL (ref 4.14–5.80)
RDW: 12.3 % (ref 11.6–15.4)
Sodium: 138 mmol/L (ref 134–144)
T3 Uptake Ratio: 25 % (ref 24–39)
T4, Total: 9.4 ug/dL (ref 4.5–12.0)
TSH: 1.78 u[IU]/mL (ref 0.450–4.500)
Total Protein: 7.2 g/dL (ref 6.0–8.5)
Triglycerides: 143 mg/dL (ref 0–149)
Uric Acid: 6.3 mg/dL (ref 3.8–8.4)
VLDL Cholesterol Cal: 25 mg/dL (ref 5–40)
WBC: 5.6 10*3/uL (ref 3.4–10.8)
eGFR: 101 mL/min/{1.73_m2} (ref >59)

## 2024-05-21 ENCOUNTER — Ambulatory Visit: Payer: Self-pay | Admitting: Physician Assistant

## 2024-05-21 ENCOUNTER — Encounter: Payer: Self-pay | Admitting: Physician Assistant

## 2024-05-21 VITALS — BP 113/81 | HR 80 | Temp 98.4°F | Resp 14 | Ht 70.0 in | Wt 224.0 lb

## 2024-05-21 DIAGNOSIS — Z0000 Encounter for general adult medical examination without abnormal findings: Secondary | ICD-10-CM

## 2024-05-21 NOTE — Progress Notes (Signed)
 Concerned with Back left flank pain every few months it will flair up.

## 2024-05-21 NOTE — Progress Notes (Signed)
 [facility] [place] [facility]   ____________________________________________   None    (approximate)  I have reviewed the triage vital signs and the nursing notes.   HISTORY  Chief Complaint Annual Exam   HPI Jacob Payne is a 33 y.o. male patient presents for annual physical exam.  Patient voices concern intermittent right flank pain.  Onset of complaint was with kidney stones.  Denies hematuria.  No palliative measures for complaint.      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   History of nephrolithiasis 11/12/2012    Past Surgical History:  Procedure Laterality Date   APPENDECTOMY     CYSTOSCOPY W/ RETROGRADES Left 04/21/2020   Procedure: CYSTOSCOPY WITH RETROGRADE PYELOGRAM;  Surgeon: Geraline Knapp, MD;  Location: [facility];  Service: Urology;  Laterality: Left;   CYSTOSCOPY WITH URETEROSCOPY, STONE BASKETRY AND STENT PLACEMENT Left 04/21/2020   Procedure: CYSTOSCOPY WITH URETEROSCOPY, STONE BASKETRY AND STENT PLACEMENT;  Surgeon: Geraline Knapp, MD;  Location: [facility];  Service: Urology;  Laterality: Left;   ESOPHAGOGASTRODUODENOSCOPY (EGD) WITH PROPOFOL  N/A 08/31/2021   Procedure: ESOPHAGOGASTRODUODENOSCOPY (EGD) WITH PROPOFOL ;  Surgeon: Irby Mannan, MD;  Location[facility];  Service: Endoscopy;  Laterality: N/A;   EXTRACORPOREAL SHOCK WAVE LITHOTRIPSY Left 04/16/2020   Procedure: EXTRACORPOREAL SHOCK WAVE LITHOTRIPSY (ESWL);  Surgeon: Geraline Knapp, MD;  Location: [facility];  Service: Urology;  Laterality: Left;   POLYPECTOMY N/A 08/31/2021   Procedure: POLYPECTOMY;  Surgeon: Irby Mannan, MD;   Location: [facility];  Service: Endoscopy;  Laterality: N/A;    Prior to Admission medications   Not on File    Allergies Patient has no known allergies.  Family History  Problem Relation Age of Onset   Diabetes Father    Cancer Maternal Grandfather     Social History Social History   Tobacco Use   Smoking status: Never    Passive exposure: Never   Smokeless tobacco: Never  Substance Use Topics   Alcohol use: Never   Drug use: Never    Review of Systems Constitutional: No fever/chills Eyes: No visual changes. ENT: No sore throat. Cardiovascular: Denies chest pain. Respiratory: Denies shortness of breath. Gastrointestinal: No abdominal pain.  No nausea, no vomiting.  No diarrhea.  No constipation. Genitourinary: Negative for dysuria. Musculoskeletal: Intermittent right flank pain. Skin: Negative for rash. Neurological: Negative for headaches, focal weakness or numbness.  ____________________________________________   PHYSICAL EXAM:  VITAL SIGNS: BP 113/81  Cuff Size Large  Pulse Rate 80  Temp 98.4 F (36.9 C)  Temp Source Temporal  Weight 224 lb (101.6 kg)  Height 5\' 10"  (1.778 m)  Resp 14  SpO2 98 %   BMI: 32.14 kg/m2  BSA: 2.24 m2   Constitutional: Alert and oriented. Well appearing and in no acute distress. Eyes: Conjunctivae are normal. PERRL. EOMI. Head: Atraumatic. Nose: No congestion/rhinnorhea. Mouth/Throat: Mucous membranes are moist.  Oropharynx non-erythematous. Neck: No stridor.  No cervical spine tenderness to palpation. Hematological/Lymphatic/Immunilogical: No cervical lymphadenopathy. Cardiovascular: Normal rate, regular rhythm. Grossly normal heart sounds.  Good peripheral circulation. Respiratory: Normal respiratory effort.  No  retractions. Lungs CTAB. Gastrointestinal: Soft and nontender. No distention. No abdominal bruits. No CVA tenderness. Genitourinary: Deferred Musculoskeletal: No lower extremity tenderness nor  edema.  No joint effusions. Neurologic:  Normal speech and language. No gross focal neurologic deficits are appreciated. No gait instability. Skin:  Skin is warm, dry and intact. No rash noted. Psychiatric: Mood and affect are normal. Speech and behavior are normal.  ____________________________________________   LABS _ Component Ref Range & Units (hover) 4 d ago (05/17/24) 1 yr ago (04/27/23) 1 yr ago (09/06/22) 1 yr ago (08/19/22) 1 yr ago (08/01/22) 1 yr ago (05/24/22) 2 yr ago (01/12/22)  Color, UA yellow yellow Yellow R Yellow R  Amber Yellow R  Clarity, UA clear clear    Clear   Glucose, UA Negative Negative    Negative   Bilirubin, UA neg neg Negative R Negative R  Negative Negative R  Ketones, UA neg neg Negative R Negative R  Negative Negative R  Spec Grav, UA 1.010 1.020 1.025 R 1.025 R  >=1.030 Abnormal  1.015 R  Blood, UA neg neg Negative R Negative R  Negative Negative R  pH, UA 6.5 6.0 5.0 R 5.0 R  5.5 7.0 R  Protein, UA Negative Negative Negative R Negative R  Positive Abnormal  CM Negative R  Urobilinogen, UA 0.2 0.2    0.2   Nitrite, UA neg neg Negative R Negative R  Negative Negative R  Leukocytes, UA Negative Negative Negative Negative  Negative Negative  Appearance     CLEAR R    Odor                    Component Ref Range & Units (hover) 4 d ago (05/17/24) 1 yr ago (04/27/23) 2 yr ago (05/19/22) 2 yr ago (07/23/21) 2 yr ago (07/23/21) 2 yr ago (07/12/21) 2 yr ago (07/06/21)  Glucose 96 92 108 High  104 High  R     Uric Acid 6.3 6.2 CM 6.3 CM      Comment:            Therapeutic target for gout patients: <6.0  BUN 11 14 14 11      Creatinine, Ser 1.01 1.16 1.17 1.30 High      eGFR 101 86 86 76     BUN/Creatinine Ratio 11 12 12 8  Low      Sodium 138 137 141 141     Potassium 4.5 4.1 4.9 4.6     Chloride 100 98 102 101     Calcium 9.7 10.0 9.8 10.0     Phosphorus 2.6 Low  3.4 3.7      Total Protein 7.2 7.5 7.0 7.4     Albumin 4.9 5.1 4.8 R 5.2 R     Globulin,  Total 2.3 2.4 2.2 2.2     Bilirubin Total 0.4 0.6 0.4 0.3     Alkaline Phosphatase 55 54 56 64     LDH 200 201 180      AST 37 29 35 30     ALT 69 High  42 73 High  29     GGT 19 22 22       Iron 106 171 High  114   119 123  Cholesterol, Total 152 172 183      Triglycerides 143 159 High  179 High       HDL 38 Low  46 40      VLDL Cholesterol Cal 25 28 31  LDL Chol Calc (NIH) 89 98 295 High       Chol/HDL Ratio 4.0 3.7 CM 4.6 CM      Comment:                                   T. Chol/HDL Ratio                                             Men  Women                               1/2 Avg.Risk  3.4    3.3                                   Avg.Risk  5.0    4.4                                2X Avg.Risk  9.6    7.1                                3X Avg.Risk 23.4   11.0  Estimated CHD Risk 0.7 0.6 CM 0.9 CM      Comment: The CHD Risk is based on the T. Chol/HDL ratio. Other factors affect CHD Risk such as hypertension, smoking, diabetes, severe obesity, and family history of premature CHD.  TSH 1.780 3.220 2.660      T4, Total 9.4 9.4 9.1      T3 Uptake Ratio 25 28 28       Free Thyroxine Index 2.4 2.6 2.5      WBC 5.6 5.0 6.1  5.0    RBC 5.15 5.45 5.28  5.09    Hemoglobin 15.8 16.3 16.3  15.8    Hematocrit 47.1 49.0 46.3  45.4    MCV 92 90 88  89    MCH 30.7 29.9 30.9  31.0    MCHC 33.5 33.3 35.2  34.8    RDW 12.3 12.3 12.2  11.9    Platelets 189 170 194  187    Neutrophils 60 52 59      Lymphs 31 36 31      Monocytes 8 9 7       Eos 1 2 2       Basos 0 1 1      Neutrophils Absolute 3.3 2.6 3.6      Lymphocytes Absolute 1.7 1.8 1.9      Monocytes Absolute 0.5 0.4 0.5      EOS (ABSOLUTE) 0.1 0.1 0.1      Basophils Absolute 0.0 0.0 0.0      Immature Granulocytes 0 0 0      Immature Grans (Abs) 0.0 0.0 0.0                  ___________________________________________   ____________________________________________     ____________________________________________   INITIAL IMPRESSION / ASSESSMENT AND PLAN   As part of my medical decision making, I reviewed the following data within the electronic MEDICAL RECORD NUMBER  No acute findings on physical exam or EKG.        ____________________________________________   FINAL CLINICAL IMPRESSION Well exam   ED Discharge Orders     None        Note:  This document was prepared using Dragon voice recognition software and may include unintentional dictation errors.

## 2024-06-17 DIAGNOSIS — G4733 Obstructive sleep apnea (adult) (pediatric): Secondary | ICD-10-CM | POA: Diagnosis not present

## 2024-07-17 DIAGNOSIS — G4733 Obstructive sleep apnea (adult) (pediatric): Secondary | ICD-10-CM | POA: Diagnosis not present

## 2024-08-17 DIAGNOSIS — G4733 Obstructive sleep apnea (adult) (pediatric): Secondary | ICD-10-CM | POA: Diagnosis not present

## 2024-08-23 NOTE — Progress Notes (Signed)
 [facility] [address] [place], [place] [address]  Pulmonary Sleep Medicine   Office Visit Note  Patient Name: Jacob Payne DOB: 1991/01/03 MRN [id number]    Chief Complaint: Obstructive Sleep Apnea visit  Brief History:  [name] is seen today for a follow up visit for CPAP@ 7 cmH2O. The patient has a 10 month history of sleep apnea. Patient is using PAP nightly.  The patient feels rested after sleeping with PAP.  The patient reports benefiting from PAP use. Reported sleepiness is  improved and the Epworth Sleepiness Score is 4 out of 24. The patient does not take naps. The patient complains of the following: none.  The compliance download shows 99% compliance with an average use time of 7 hours 45 minutes. The AHI is 0.7.  The patient does not complain of limb movements disrupting sleep. The patient continues to require PAP therapy in order to eliminate sleep apnea.   ROS  General: (-) fever, (-) chills, (-) night sweat Nose and Sinuses: (-) nasal stuffiness or itchiness, (-) postnasal drip, (-) nosebleeds, (-) sinus trouble. Mouth and Throat: (-) sore throat, (-) hoarseness. Neck: (-) swollen glands, (-) enlarged thyroid, (-) neck pain. Respiratory: - cough, - shortness of breath, - wheezing. Neurologic: - numbness, - tingling. Psychiatric: - anxiety, - depression   Current Medication: No outpatient encounter medications on file as of 08/26/2024.   No facility-administered encounter medications on file as of 08/26/2024.    Surgical History: Past Surgical History:  Procedure Laterality Date   APPENDECTOMY     CYSTOSCOPY W/ RETROGRADES Left 04/21/2020   Procedure: CYSTOSCOPY WITH RETROGRADE PYELOGRAM;  Surgeon: Jacob Glendia BROCKS, MD;  Location: [facility];  Service: Urology;  Laterality: Left;   CYSTOSCOPY WITH URETEROSCOPY, STONE BASKETRY AND STENT PLACEMENT Left 04/21/2020   Procedure: CYSTOSCOPY WITH URETEROSCOPY, STONE BASKETRY AND STENT PLACEMENT;   Surgeon: Jacob Glendia BROCKS, MD;  Location: [facility];  Service: Urology;  Laterality: Left;   ESOPHAGOGASTRODUODENOSCOPY (EGD) WITH PROPOFOL  N/A 08/31/2021   Procedure: ESOPHAGOGASTRODUODENOSCOPY (EGD) WITH PROPOFOL ;  Surgeon: Jacob Keene NOVAK, MD;  Location: [facility];  Service: Endoscopy;  Laterality: N/A;   EXTRACORPOREAL SHOCK WAVE LITHOTRIPSY Left 04/16/2020   Procedure: EXTRACORPOREAL SHOCK WAVE LITHOTRIPSY (ESWL);  Surgeon: Jacob Glendia BROCKS, MD;  Location: [facility];  Service: Urology;  Laterality: Left;   POLYPECTOMY N/A 08/31/2021   Procedure: POLYPECTOMY;  Surgeon: Jacob Keene NOVAK, MD;  Location: [facility];  Service: Endoscopy;  Laterality: N/A;    Medical History: Past Medical History:  Diagnosis Date   Elevated fasting glucose    Elevated LFTs    GERD (gastroesophageal reflux disease)    Overweight     Family History: Non contributory to the present illness  Social History: Social History   Socioeconomic History   Marital status: Married    Spouse name: Not on file   Number of children: Not on file   Years of education: Not on file   Highest education level: Not on file  Occupational History   Not on file  Tobacco Use   Smoking status: Never    Passive exposure: Never   Smokeless tobacco: Never  Substance and Sexual Activity   Alcohol use: Never   Drug use: Never   Sexual activity: Yes  Other Topics Concern   Not on file  Social History Narrative   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 Lives [place].    Social Drivers of Corporate investment banker  Strain: Not on file  Food Insecurity: Not on file  Transportation Needs: Not on file  Physical Activity: Not on file  Stress: Not on file  Social Connections: Not on file  Intimate Partner Violence: Not on file    Vital Signs: There were no vitals taken for this visit. There is no height or weight on file to calculate BMI.    Examination: General  Appearance: The patient is well-developed, well-nourished, and in no distress. Neck Circumference: 40 cm Skin: Gross inspection of skin unremarkable. Head: normocephalic, no gross deformities. Eyes: no gross deformities noted. ENT: ears appear grossly normal Neurologic: Alert and oriented. No involuntary movements.  STOP BANG RISK ASSESSMENT S (snore) Have you been told that you snore?     NO   T (tired) Are you often tired, fatigued, or sleepy during the day?   NO  O (obstruction) Do you stop breathing, choke, or gasp during sleep? NO   P (pressure) Do you have or are you being treated for high blood pressure? NO   B (BMI) Is your body index greater than 35 kg/m? NO   A (age) Are you 26 years old or older? NO   N (neck) Do you have a neck circumference greater than 16 inches?   NO   G (gender) Are you a male? YES   TOTAL STOP/BANG "YES" ANSWERS 1       A STOP-Bang score of 2 or less is considered low risk, and a score of 5 or more is high risk for having either moderate or severe OSA. For people who score 3 or 4, doctors may need to perform further assessment to determine how likely they are to have OSA.         EPWORTH SLEEPINESS SCALE:  Scale:  (0)= no chance of dozing; (1)= slight chance of dozing; (2)= moderate chance of dozing; (3)= high chance of dozing  Chance  Situtation    Sitting and reading: 0    Watching TV: 1    Sitting Inactive in public: 0    As a passenger in car: 1      Lying down to rest: 2    Sitting and talking: 0    Sitting quielty after lunch: 0    In a car, stopped in traffic: 0   TOTAL SCORE:   4 out of 24    SLEEP STUDIES:  PSG - 10/18/2023 - AHI 8.8/hr, REM AHI 34/hr, min Sp02 82% Titration - . 11/24/2023 -   CPAP @ 7cmH20   CPAP COMPLIANCE DATA:  Date Range: 02/27/2024-08/24/2024  Average Daily Use: 7 hours 45 minutes  Median Use: 7 hours 46 minutes  Compliance for > 4 Hours: 99%  AHI: 0.7 respiratory events per  hour  Days Used: 179/180 days  Mask Leak: 18.9  95th Percentile Pressure: 7         LABS: No results found for this or any previous visit (from the past 2160 hours).  Radiology: US  RENAL Result Date: 08/18/2022 CLINICAL DATA:  Acute left flank pain for 3 weeks EXAM: RENAL / URINARY TRACT ULTRASOUND COMPLETE COMPARISON:  None Available. FINDINGS: Right Kidney: Renal measurements: 10.5 x 5.1 x 6.0 cm = volume: 169 mL. Echogenicity within normal limits. No mass or hydronephrosis visualized. Left Kidney: Renal measurements: 11.4 x 5.2 x 5.3 cm = volume: 170 mL. Echogenicity within normal limits. No mass or hydronephrosis visualized. Bladder: There is a 24 cc postvoid residual. The bladder is otherwise normal in appearance.  Other: None. IMPRESSION: 1. Mildly prominent bilateral renal pelvises without caliectasis. No hydronephrosis. No other abnormalities. Electronically Signed   By: Alm Pouch III M.D.   On: 08/18/2022 08:08    No results found.  No results found.    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 OSA (obstructive sleep apnea) (Primary) The patient does tolerate PAP and reports  benefit from PAP use. The patient was reminded how to clean equipment and advised to replace supplies routinely. The patient was also counselled on weight loss. The compliance is excellent. The AHI is 0.7.   OSA on cpap- controlled. Continue with excellent compliance with pap. CPAP continues to be medically necessary to treat this patient's OSA. F/u one year.    2. CPAP use counseling CPAP Counseling: had a lengthy discussion with the patient regarding the importance of PAP therapy in management of the sleep apnea. Patient appears to understand the risk  factor reduction and also understands the risks associated with untreated sleep apnea. Patient will try to make a good faith effort to remain compliant with therapy. Also instructed the patient on proper cleaning of the device including the water  must be changed daily if possible and use of distilled water  is preferred. Patient understands that the machine should be regularly cleaned with appropriate recommended cleaning solutions that do not damage the PAP machine for example given white vinegar and water  rinses. Other methods such as ozone treatment may not be as good as these simple methods to achieve cleaning.      General Counseling: I have discussed the findings of the evaluation and examination with Bradan.  I have also discussed any further diagnostic evaluation thatmay be needed or ordered today. Gerrald verbalizes understanding of the findings of todays visit. We also reviewed his medications today and discussed drug interactions and side effects including but not limited excessive drowsiness and altered mental states. We also discussed that there is always a risk not just to him but also people around him. he has been encouraged to call the office with any questions or concerns that should arise related to todays visit.  No orders of the defined types were placed in this encounter.       I have personally obtained a history, examined the patient, evaluated laboratory and imaging results, formulated the assessment and plan and placed orders. This patient was seen today by Lauraine Lay, PA-C in collaboration with Dr. Elfreda Bathe.   Elfreda DELENA Bathe, MD [facility] Diplomate ABMS Pulmonary Critical Care Medicine and Sleep Medicine

## 2024-08-26 ENCOUNTER — Ambulatory Visit (INDEPENDENT_AMBULATORY_CARE_PROVIDER_SITE_OTHER): Admitting: Internal Medicine

## 2024-08-26 VITALS — BP 129/91 | HR 72 | Resp 16 | Ht 69.0 in | Wt 219.0 lb

## 2024-08-26 DIAGNOSIS — G4733 Obstructive sleep apnea (adult) (pediatric): Secondary | ICD-10-CM

## 2024-08-26 DIAGNOSIS — Z7189 Other specified counseling: Secondary | ICD-10-CM

## 2024-08-26 NOTE — Patient Instructions (Signed)

## 2024-09-17 DIAGNOSIS — G4733 Obstructive sleep apnea (adult) (pediatric): Secondary | ICD-10-CM | POA: Diagnosis not present
